# Patient Record
Sex: Female | Born: 1980 | Race: White | Hispanic: No | Marital: Married | Smoking: Never smoker
Health system: Southern US, Community
[De-identification: ages and names within clinical notes are randomized; demographics above are authoritative.]

## PROBLEM LIST (undated history)

## (undated) ENCOUNTER — Inpatient Hospital Stay (HOSPITAL_COMMUNITY): Payer: Self-pay

## (undated) DIAGNOSIS — G44009 Cluster headache syndrome, unspecified, not intractable: Secondary | ICD-10-CM

## (undated) DIAGNOSIS — S83519A Sprain of anterior cruciate ligament of unspecified knee, initial encounter: Secondary | ICD-10-CM

## (undated) HISTORY — PX: NASAL SEPTUM SURGERY: SHX37

## (undated) HISTORY — PX: ANTERIOR CRUCIATE LIGAMENT REPAIR: SHX115

## (undated) HISTORY — PX: HERNIA REPAIR: SHX51

---

## 2008-05-13 ENCOUNTER — Emergency Department: Payer: Self-pay | Admitting: Emergency Medicine

## 2009-12-18 ENCOUNTER — Encounter: Admission: RE | Admit: 2009-12-18 | Discharge: 2009-12-18 | Payer: Self-pay | Admitting: Sports Medicine

## 2011-05-31 ENCOUNTER — Inpatient Hospital Stay (HOSPITAL_COMMUNITY)
Admission: AD | Admit: 2011-05-31 | Discharge: 2011-06-01 | Disposition: A | Discharge status: Home / Self Care | Payer: Self-pay | Source: Ambulatory Visit | Attending: Obstetrics & Gynecology | Admitting: Obstetrics & Gynecology

## 2011-05-31 ENCOUNTER — Encounter (HOSPITAL_COMMUNITY): Payer: Self-pay | Admitting: *Deleted

## 2011-05-31 ENCOUNTER — Inpatient Hospital Stay (HOSPITAL_COMMUNITY): Payer: Self-pay

## 2011-05-31 DIAGNOSIS — N949 Unspecified condition associated with female genital organs and menstrual cycle: Secondary | ICD-10-CM | POA: Insufficient documentation

## 2011-05-31 DIAGNOSIS — N938 Other specified abnormal uterine and vaginal bleeding: Secondary | ICD-10-CM | POA: Insufficient documentation

## 2011-05-31 HISTORY — DX: Cluster headache syndrome, unspecified, not intractable: G44.009

## 2011-05-31 HISTORY — DX: Sprain of anterior cruciate ligament of unspecified knee, initial encounter: S83.519A

## 2011-05-31 LAB — BASIC METABOLIC PANEL
CO2: 25 mEq/L (ref 19–32)
Calcium: 8.5 mg/dL (ref 8.4–10.5)
Creatinine, Ser: 0.65 mg/dL (ref 0.50–1.10)
GFR calc Af Amer: >90 mL/min (ref >90)
GFR calc non Af Amer: >90 mL/min (ref >90)
Potassium: 3.2 mEq/L — ABNORMAL LOW (ref 3.5–5.1)

## 2011-05-31 LAB — CBC
Hemoglobin: 10.7 g/dL — ABNORMAL LOW (ref 12.0–15.0)
Platelets: 215 10*3/uL (ref 150–400)
RBC: 3.41 MIL/uL — ABNORMAL LOW (ref 3.87–5.11)

## 2011-05-31 LAB — DIFFERENTIAL
Basophils Relative: 0 % (ref 0–1)
Eosinophils Absolute: 0.1 10*3/uL (ref 0.0–0.7)
Eosinophils Relative: 1 % (ref 0–5)
Lymphocytes Relative: 19 % (ref 12–46)
Lymphs Abs: 2.2 10*3/uL (ref 0.7–4.0)
Monocytes Relative: 7 % (ref 3–12)

## 2011-05-31 LAB — HCG, QUANTITATIVE, PREGNANCY: hCG, Beta Chain, Quant, S: 52 m[IU]/mL — ABNORMAL HIGH (ref <5)

## 2011-05-31 MED ORDER — METHYLERGONOVINE MALEATE 0.2 MG PO TABS
0.2000 mg | ORAL_TABLET | Freq: Four times a day (QID) | ORAL | Status: DC
  Administered 2011-05-31: 0.2 mg via ORAL
  Filled 2011-05-31: qty 1

## 2011-05-31 NOTE — ED Provider Notes (Signed)
History     Chief Complaint  Patient presents with  . Vaginal Bleeding   HPI 30 yr old G1 P0010 presents via EMS with report of heavy vaginal bleeding.  Had EAB on 05/13/11 and had "normal" bleeding afterward.  Flew home today and had intercourse. About 3 hrs later had heavy vaginal bleeding. States it covered her legs and feet and covered the Bathroom floor. Called EMS who stated she soaked 3 pads on the way in. Reports some cramping but no significant pain. No fever or other symptoms.    Past Medical History  Diagnosis Date  . Cluster headache   . Torn ACL     x3    Past Surgical History  Procedure Date  . Anterior cruciate ligament repair     No family history on file.  History  Substance Use Topics  . Smoking status: Never Smoker   . Smokeless tobacco: Not on file  . Alcohol Use: 0.6 oz/week    1 Cans of beer per week    Allergies: No Known Allergies  Prescriptions prior to admission  Medication Sig Dispense Refill  . acetaminophen (TYLENOL) 500 MG tablet Take 500 mg by mouth every 6 (six) hours as needed. For pain       . norethindrone-ethinyl estradiol (OVCON-50) 1-50 MG-MCG tablet Take 1 tablet by mouth daily.          Review of Systems  Constitutional: Negative for fever.  Gastrointestinal: Negative for nausea and abdominal pain.  Genitourinary:       Heavy vaginal bleeding   Physical Exam   Blood pressure 130/75, pulse 97, temperature 98.1 F (36.7 C), temperature source Oral, resp. rate 20, height 5\' 5"  (1.651 m), weight 143 lb (64.864 kg), last menstrual period 05/14/2011.  Physical Exam  Constitutional: She is oriented to person, place, and time. She appears well-developed and well-nourished.  HENT:  Head: Normocephalic.  Cardiovascular: Normal rate.   Respiratory: Effort normal.  GI: Soft. She exhibits no distension and no mass. There is no tenderness. There is no rebound and no guarding.  Genitourinary: Uterus normal. Vaginal discharge found.       Dried blood on legs but no frank bleeding. Ext Gen. Wnl. 2 small clots in vagina. No active bleeding from cervix which is closed. Uterus slightly posterior, nontender. Adnexae nontender.  Neurological: She is alert and oriented to person, place, and time.  Skin: Skin is warm and dry.  Psychiatric: She has a normal mood and affect.   Results for orders placed during the hospital encounter of 05/31/11 (from the past 24 hour(s))  CBC     Status: Abnormal   Collection Time   05/31/11  9:23 PM      Component Value Range   WBC 11.7 (*) 4.0 - 10.5 (K/uL)   RBC 3.41 (*) 3.87 - 5.11 (MIL/uL)   Hemoglobin 10.7 (*) 12.0 - 15.0 (g/dL)   HCT 45.4 (*) 09.8 - 46.0 (%)   MCV 93.8  78.0 - 100.0 (fL)   MCH 31.4  26.0 - 34.0 (pg)   MCHC 33.4  30.0 - 36.0 (g/dL)   RDW 11.9  14.7 - 82.9 (%)   Platelets 215  150 - 400 (K/uL)  DIFFERENTIAL     Status: Abnormal   Collection Time   05/31/11  9:23 PM      Component Value Range   Neutrophils Relative 74  43 - 77 (%)   Neutro Abs 8.6 (*) 1.7 - 7.7 (K/uL)   Lymphocytes  Relative 19  12 - 46 (%)   Lymphs Abs 2.2  0.7 - 4.0 (K/uL)   Monocytes Relative 7  3 - 12 (%)   Monocytes Absolute 0.8  0.1 - 1.0 (K/uL)   Eosinophils Relative 1  0 - 5 (%)   Eosinophils Absolute 0.1  0.0 - 0.7 (K/uL)   Basophils Relative 0  0 - 1 (%)   Basophils Absolute 0.0  0.0 - 0.1 (K/uL)  HCG, QUANTITATIVE, PREGNANCY     Status: Abnormal   Collection Time   05/31/11  9:23 PM      Component Value Range   hCG, Beta Chain, Quant, S 52 (*) <5 (mIU/mL)  BASIC METABOLIC PANEL     Status: Abnormal   Collection Time   05/31/11  9:23 PM      Component Value Range   Sodium 134 (*) 135 - 145 (mEq/L)   Potassium 3.2 (*) 3.5 - 5.1 (mEq/L)   Chloride 103  96 - 112 (mEq/L)   CO2 25  19 - 32 (mEq/L)   Glucose, Bld 112 (*) 70 - 99 (mg/dL)   BUN 11  6 - 23 (mg/dL)   Creatinine, Ser 1.61  0.50 - 1.10 (mg/dL)   Calcium 8.5  8.4 - 09.6 (mg/dL)   GFR calc non Af Amer >90  >90 (mL/min)   GFR  calc Af Amer >90  >90 (mL/min)    MAU Course  Procedures   Assessment and Plan  Discussed with Dr Arlyce Dice when I found out she went to his office rather than [place].  He will come and see her tonight.   [facility] 05/31/2011, 10:27 PM

## 2011-05-31 NOTE — Progress Notes (Signed)
Had TAB on 9/26, states had normal bleeding afterward.  Flew home from [place] today, had intercourse today, began bleeding heavily with dizziness approx. 3 hrs later.

## 2011-06-01 ENCOUNTER — Encounter (HOSPITAL_COMMUNITY)
Admission: AD | Disposition: A | Discharge status: Home / Self Care | Payer: Self-pay | Source: Ambulatory Visit | Attending: Obstetrics & Gynecology

## 2011-06-01 LAB — GC/CHLAMYDIA PROBE AMP, GENITAL: GC Probe Amp, Genital: NEGATIVE

## 2011-06-01 MED ORDER — METHYLERGONOVINE MALEATE 0.2 MG PO TABS: 0.2000 mg | ORAL_TABLET | Freq: Four times a day (QID) | ORAL | Status: AC

## 2011-06-01 MED ORDER — MISOPROSTOL 200 MCG PO TABS: 200.0000 ug | ORAL_TABLET | ORAL | Status: DC

## 2011-06-01 NOTE — Progress Notes (Signed)
Dr Arlyce Dice in discussing plan of care with the pt

## 2014-06-18 ENCOUNTER — Encounter (HOSPITAL_COMMUNITY): Payer: Self-pay | Admitting: *Deleted

## 2016-04-21 LAB — OB RESULTS CONSOLE RUBELLA ANTIBODY, IGM: RUBELLA: NON-IMMUNE/NOT IMMUNE

## 2016-04-21 LAB — OB RESULTS CONSOLE GC/CHLAMYDIA
CHLAMYDIA, DNA PROBE: NEGATIVE
GC PROBE AMP, GENITAL: NEGATIVE

## 2016-04-21 LAB — OB RESULTS CONSOLE ANTIBODY SCREEN: ANTIBODY SCREEN: NEGATIVE

## 2016-04-21 LAB — OB RESULTS CONSOLE ABO/RH: RH Type: POSITIVE

## 2016-04-21 LAB — OB RESULTS CONSOLE HIV ANTIBODY (ROUTINE TESTING): HIV: NONREACTIVE

## 2016-04-21 LAB — OB RESULTS CONSOLE RPR: RPR: NONREACTIVE

## 2016-04-21 LAB — OB RESULTS CONSOLE HEPATITIS B SURFACE ANTIGEN
HEP B S AG: POSITIVE
Hepatitis B Surface Ag: NEGATIVE

## 2016-05-21 ENCOUNTER — Emergency Department (HOSPITAL_COMMUNITY): Payer: BC Managed Care – PPO

## 2016-05-21 ENCOUNTER — Encounter (HOSPITAL_COMMUNITY): Payer: Self-pay

## 2016-05-21 ENCOUNTER — Emergency Department (HOSPITAL_COMMUNITY)
Admission: EM | Admit: 2016-05-21 | Discharge: 2016-05-21 | Disposition: A | Discharge status: Home / Self Care | Payer: BC Managed Care – PPO | Attending: Emergency Medicine | Admitting: Emergency Medicine

## 2016-05-21 DIAGNOSIS — S161XXA Strain of muscle, fascia and tendon at neck level, initial encounter: Secondary | ICD-10-CM | POA: Diagnosis not present

## 2016-05-21 DIAGNOSIS — Y92410 Unspecified street and highway as the place of occurrence of the external cause: Secondary | ICD-10-CM | POA: Insufficient documentation

## 2016-05-21 DIAGNOSIS — O9A212 Injury, poisoning and certain other consequences of external causes complicating pregnancy, second trimester: Secondary | ICD-10-CM | POA: Diagnosis not present

## 2016-05-21 DIAGNOSIS — Y999 Unspecified external cause status: Secondary | ICD-10-CM | POA: Diagnosis not present

## 2016-05-21 DIAGNOSIS — Z3A17 17 weeks gestation of pregnancy: Secondary | ICD-10-CM | POA: Insufficient documentation

## 2016-05-21 DIAGNOSIS — Y939 Activity, unspecified: Secondary | ICD-10-CM | POA: Insufficient documentation
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
## 2016-09-12 ENCOUNTER — Inpatient Hospital Stay (HOSPITAL_COMMUNITY)
Admission: AD | Admit: 2016-09-12 | Discharge: 2016-09-12 | Disposition: A | Discharge status: Home / Self Care | Payer: BC Managed Care – PPO | Source: Ambulatory Visit | Attending: Obstetrics and Gynecology | Admitting: Obstetrics and Gynecology

## 2016-09-12 ENCOUNTER — Encounter (HOSPITAL_COMMUNITY): Payer: Self-pay | Admitting: *Deleted

## 2016-09-12 DIAGNOSIS — Z3A33 33 weeks gestation of pregnancy: Secondary | ICD-10-CM | POA: Diagnosis not present

## 2016-09-12 DIAGNOSIS — Z79899 Other long term (current) drug therapy: Secondary | ICD-10-CM | POA: Diagnosis not present

## 2016-09-12 DIAGNOSIS — Z9889 Other specified postprocedural states: Secondary | ICD-10-CM | POA: Diagnosis not present

## 2016-09-12 DIAGNOSIS — O368130 Decreased fetal movements, third trimester, not applicable or unspecified: Secondary | ICD-10-CM

## 2016-09-12 DIAGNOSIS — Z3689 Encounter for other specified antenatal screening: Secondary | ICD-10-CM

## 2016-09-12 NOTE — MAU Note (Signed)
Pt states she started to feel the baby move less last night but she wasn't really counting.  So this morning she paid attention and tried to count the movements after eating breakfast and then drinking water.  Pt states for the first two hours she got 12 movements in 2 hours.  Pt states she then called her doctor and he told her to count them again after eating some more and that time she got 4 in one hour.  Pt states she was seen at the office earlier this week and was told her fundal height is not changing so she has a growth ultrasound scheduled for Wednesday 09/16/16.

## 2016-09-12 NOTE — MAU Provider Note (Signed)
  History     CSN: [id number]  Arrival date and time: 09/12/16 0808   First Provider Initiated Contact with Patient 09/12/16 [phone]      Chief Complaint  Patient presents with  . Decreased Fetal Movement   HPI   Ms.Elizabeth Caldwell is a 36 y.o. female G2P0010 @ 5848w2d here in MAU with decreased fetal movement. She is feeling movement, however the movement is very light and subtle "no strong movements". At 0530 this morning she did kick counts and she felt 12 subtle movements in 2 hours.   OB History    Gravida Para Term Preterm AB Living   2       1     SAB TAB Ectopic Multiple Live Births     1            Past Medical History:  Diagnosis Date  . Cluster headache   . Torn ACL    x3    Past Surgical History:  Procedure Laterality Date  . ANTERIOR CRUCIATE LIGAMENT REPAIR      History reviewed. No pertinent family history.  Social History  Substance Use Topics  . Smoking status: Never Smoker  . Smokeless tobacco: Never Used  . Alcohol use 0.6 oz/week    1 Cans of beer per week    Allergies: No Known Allergies  Prescriptions Prior to Admission  Medication Sig Dispense Refill Last Dose  . acetaminophen (TYLENOL) 500 MG tablet Take 500 mg by mouth every 6 (six) hours as needed. For pain    Past Week at Unknown time  . misoprostol (CYTOTEC) 200 MCG tablet Take 1 tablet (200 mcg total) by mouth every 4 (four) hours. 4 tablet 1   . Prenatal Vit-Fe Fumarate-FA (PRENATAL MULTIVITAMIN) TABS tablet Take 1 tablet by mouth daily at 12 noon.   05/21/2016 at Unknown time   No results found for this or any previous visit (from the past 48 hour(s)).  Review of Systems  Gastrointestinal: Negative for abdominal pain.  Genitourinary: Negative for dysuria and vaginal bleeding.   Physical Exam   Blood pressure 123/73, pulse 101, temperature 98.1 F (36.7 C), temperature source Oral, resp. rate 17, last menstrual period 01/23/2016, SpO2 99 %.  Physical Exam  Constitutional: She  is oriented to person, place, and time. She appears well-developed and well-nourished. No distress.  HENT:  Head: Normocephalic.  Eyes: Pupils are equal, round, and reactive to light.  Musculoskeletal: Normal range of motion.  Neurological: She is alert and oriented to person, place, and time.  Skin: Skin is warm. She is not diaphoretic.  Psychiatric: Her behavior is normal.   Fetal Tracing: Baseline: 125 bpm  Variability: Moderate  Accelerations: 15x15 Decelerations: none Toco: 1 contraction   MAU Course  Procedures  None  MDM  Discussed patient with Dr. Dareen PianoAnderson, discussed fetal tracing. Ok to DC home.   Assessment and Plan   A:  1. Decreased fetal movements in third trimester, single or unspecified fetus   2. NST (non-stress test) reactive     P:  Discharge home in stable condition Return to MAU if symptoms worsen Discussed normalcy of fetal kick counts Anterior placenta per prenatal record. Discussed with patient in detail.   Duane LopeJennifer I Rasch, NP 09/12/2016 4:36 PM

## 2016-09-12 NOTE — Discharge Instructions (Signed)
Nonstress Test The nonstress test is a procedure that monitors the fetus's heartbeat. The test will monitor the heartbeat when the fetus is at rest and while the fetus is moving. In a healthy fetus, there will be an increase in fetal heart rate when the fetus moves or kicks. The heart rate will decrease at rest. This test helps determine if the fetus is healthy. Your health care provider will look at a number of patterns in the heart rate tracing to make sure your baby is thriving. If there is concern, your health care provider may order additional tests or may suggest another course of action. This test is often done in the third trimester and can help determine if an early delivery is needed and safe. Common reasons to have this test are:  You are past your due date.  You have a high-risk pregnancy.  You are feeling less movement than normal.  You have lost a pregnancy in the past.  Your health care provider suspects fetal growth problems.  You have too much or too little amniotic fluid. BEFORE THE PROCEDURE  Eat a meal right before the test or as directed by your health care provider. Food may help stimulate fetal movements.  Use the restroom right before the test. PROCEDURE  Two belts will be placed around your abdomen. These belts have monitors attached to them. One records the fetal heart rate and the other records uterine contractions.  You may be asked to lie down on your side or to stay sitting upright.  You may be given a button to press when you feel movement.  The fetal heartbeat is listened to and watched on a screen. The heartbeat is recorded on a sheet of paper.  If the fetus seems to be sleeping, you may be asked to drink some juice or soda, gently press your abdomen, or make some noise to wake the fetus. AFTER THE PROCEDURE  Your health care provider will discuss the test results with you and make recommendations for the near future. This information is not intended  to replace advice given to you by your health care provider. Make sure you discuss any questions you have with your health care provider. Document Released: 07/24/2002 Document Revised: 08/24/2014 Document Reviewed: 09/06/2012 Elsevier Interactive Patient Education  2017 [employer]  

## 2016-10-02 ENCOUNTER — Encounter (HOSPITAL_COMMUNITY): Payer: Self-pay | Admitting: *Deleted

## 2016-10-02 ENCOUNTER — Telehealth (HOSPITAL_COMMUNITY): Payer: Self-pay | Admitting: *Deleted

## 2016-10-02 NOTE — Telephone Encounter (Signed)
Preadmission screen  

## 2016-10-08 ENCOUNTER — Observation Stay (HOSPITAL_COMMUNITY)
Admission: RE | Admit: 2016-10-08 | Discharge: 2016-10-08 | Disposition: A | Discharge status: Home / Self Care | Payer: BC Managed Care – PPO | Source: Ambulatory Visit | Attending: Obstetrics and Gynecology | Admitting: Obstetrics and Gynecology

## 2016-10-08 ENCOUNTER — Encounter (HOSPITAL_COMMUNITY): Payer: Self-pay

## 2016-10-08 DIAGNOSIS — Z3A37 37 weeks gestation of pregnancy: Secondary | ICD-10-CM | POA: Diagnosis not present

## 2016-10-08 DIAGNOSIS — O321XX0 Maternal care for breech presentation, not applicable or unspecified: Secondary | ICD-10-CM | POA: Diagnosis present

## 2016-10-08 LAB — OB RESULTS CONSOLE GBS: STREP GROUP B AG: NEGATIVE

## 2016-10-08 MED ORDER — LACTATED RINGERS IV SOLN
INTRAVENOUS | Status: DC
  Administered 2016-10-08: 08:00:00 via INTRAVENOUS

## 2016-10-08 MED ORDER — TERBUTALINE SULFATE 1 MG/ML IJ SOLN
INTRAMUSCULAR | Status: AC
  Administered 2016-10-08: 0.25 mg via SUBCUTANEOUS
  Filled 2016-10-08: qty 1

## 2016-10-08 MED ORDER — TERBUTALINE SULFATE 1 MG/ML IJ SOLN
0.2500 mg | Freq: Once | INTRAMUSCULAR | Status: AC
  Administered 2016-10-08: 0.25 mg via SUBCUTANEOUS

## 2016-10-08 NOTE — H&P (Signed)
H&P  Pt is a 36 y/o white female at 37 weeks who presents for an attempt at an external version for breech presentation. PNC is uncomplicated PMHX see [place] PE: ABD- gravid, still breech by u/s FHTs reactive IMP/ IUP at 37 weeks         Breech Plan/ Attempt external version
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
## 2016-10-08 NOTE — Discharge Instructions (Signed)
Braxton Hicks Contractions Contractions of the uterus can occur throughout pregnancy. Contractions are not always a sign that you are in labor.  WHAT ARE BRAXTON HICKS CONTRACTIONS?  Contractions that occur before labor are called Braxton Hicks contractions, or false labor. Toward the end of pregnancy (32-34 weeks), these contractions can develop more often and may become more forceful. This is not true labor because these contractions do not result in opening (dilatation) and thinning of the cervix. They are sometimes difficult to tell apart from true labor because these contractions can be forceful and people have different pain tolerances. You should not feel embarrassed if you go to the hospital with false labor. Sometimes, the only way to tell if you are in true labor is for your health care provider to look for changes in the cervix. If there are no prenatal problems or other health problems associated with the pregnancy, it is completely safe to be sent home with false labor and await the onset of true labor. HOW CAN YOU TELL THE DIFFERENCE BETWEEN TRUE AND FALSE LABOR? False Labor   The contractions of false labor are usually shorter and not as hard as those of true labor.   The contractions are usually irregular.   The contractions are often felt in the front of the lower abdomen and in the groin.   The contractions may go away when you walk around or change positions while lying down.   The contractions get weaker and are shorter lasting as time goes on.   The contractions do not usually become progressively stronger, regular, and closer together as with true labor.  True Labor   Contractions in true labor last 30-70 seconds, become very regular, usually become more intense, and increase in frequency.   The contractions do not go away with walking.   The discomfort is usually felt in the top of the uterus and spreads to the lower abdomen and low back.   True labor can be  determined by your health care provider with an exam. This will show that the cervix is dilating and getting thinner.  WHAT TO REMEMBER  Keep up with your usual exercises and follow other instructions given by your health care provider.   Take medicines as directed by your health care provider.   Keep your regular prenatal appointments.   Eat and drink lightly if you think you are going into labor.   If Braxton Hicks contractions are making you uncomfortable:   Change your position from lying down or resting to walking, or from walking to resting.   Sit and rest in a tub of warm water.   Drink 2-3 glasses of water. Dehydration may cause these contractions.   Do slow and deep breathing several times an hour.  WHEN SHOULD I SEEK IMMEDIATE MEDICAL CARE? Seek immediate medical care if:  Your contractions become stronger, more regular, and closer together.   You have fluid leaking or gushing from your vagina.   You have a fever.   You pass blood-tinged mucus.   You have vaginal bleeding.   You have continuous abdominal pain.   You have low back pain that you never had before.   You feel your baby's head pushing down and causing pelvic pressure.   Your baby is not moving as much as it used to.  This information is not intended to replace advice given to you by your health care provider. Make sure you discuss any questions you have with your health care   provider. Document Released: 08/03/2005 Document Revised: 11/25/2015 Document Reviewed: 05/15/2013 Elsevier Interactive Patient Education  2017 [employer] Introduction Patient Name: ________________________________________________ Patient Due Date: ____________________ What is a fetal movement count? A fetal movement count is the number of times that you feel your baby move during a certain amount of time. This may also be called a fetal kick count. A fetal movement count is recommended for every pregnant  woman. You may be asked to start counting fetal movements as early as week 28 of your pregnancy. Pay attention to when your baby is most active. You may notice your baby's sleep and wake cycles. You may also notice things that make your baby move more. You should do a fetal movement count:  When your baby is normally most active.  At the same time each day. A good time to count movements is while you are resting, after having something to eat and drink. How do I count fetal movements? 1. Find a quiet, comfortable area. Sit, or lie down on your side. 2. Write down the date, the start time and stop time, and the number of movements that you felt between those two times. Take this information with you to your health care visits. 3. For 2 hours, count kicks, flutters, swishes, rolls, and jabs. You should feel at least 10 movements during 2 hours. 4. You may stop counting after you have felt 10 movements. 5. If you do not feel 10 movements in 2 hours, have something to eat and drink. Then, keep resting and counting for 1 hour. If you feel at least 4 movements during that hour, you may stop counting. Contact a health care provider if:  You feel fewer than 4 movements in 2 hours.  Your baby is not moving like he or she usually does. Date: ____________ Start time: ____________ Stop time: ____________ Movements: ____________ Date: ____________ Start time: ____________ Stop time: ____________ Movements: ____________ Date: ____________ Start time: ____________ Stop time: ____________ Movements: ____________ Date: ____________ Start time: ____________ Stop time: ____________ Movements: ____________ Date: ____________ Start time: ____________ Stop time: ____________ Movements: ____________ Date: ____________ Start time: ____________ Stop time: ____________ Movements: ____________ Date: ____________ Start time: ____________ Stop time: ____________ Movements: ____________ Date: ____________ Start time:  ____________ Stop time: ____________ Movements: ____________ Date: ____________ Start time: ____________ Stop time: ____________ Movements: ____________ This information is not intended to replace advice given to you by your health care provider. Make sure you discuss any questions you have with your health care provider. Document Released: 09/02/2006 Document Revised: 04/01/2016 Document Reviewed: 09/12/2015 Elsevier Interactive Patient Education  2017 Elsevier Inc.  

## 2016-10-11 ENCOUNTER — Encounter (HOSPITAL_COMMUNITY): Payer: Self-pay

## 2016-10-11 ENCOUNTER — Inpatient Hospital Stay (HOSPITAL_COMMUNITY): Payer: BC Managed Care – PPO | Admitting: Anesthesiology

## 2016-10-11 ENCOUNTER — Encounter (HOSPITAL_COMMUNITY)
Admission: AD | Disposition: A | Discharge status: Home / Self Care | Payer: Self-pay | Source: Ambulatory Visit | Attending: Obstetrics and Gynecology

## 2016-10-11 ENCOUNTER — Inpatient Hospital Stay (HOSPITAL_COMMUNITY)
Admission: AD | Admit: 2016-10-11 | Discharge: 2016-10-13 | DRG: 766 | Disposition: A | Discharge status: Home / Self Care | Payer: BC Managed Care – PPO | Source: Ambulatory Visit | Attending: Obstetrics and Gynecology | Admitting: Obstetrics and Gynecology

## 2016-10-11 DIAGNOSIS — O321XX0 Maternal care for breech presentation, not applicable or unspecified: Secondary | ICD-10-CM | POA: Diagnosis present

## 2016-10-11 DIAGNOSIS — Z3A37 37 weeks gestation of pregnancy: Secondary | ICD-10-CM

## 2016-10-11 DIAGNOSIS — Z3493 Encounter for supervision of normal pregnancy, unspecified, third trimester: Secondary | ICD-10-CM | POA: Diagnosis present

## 2016-10-11 DIAGNOSIS — Z8249 Family history of ischemic heart disease and other diseases of the circulatory system: Secondary | ICD-10-CM | POA: Diagnosis not present

## 2016-10-11 DIAGNOSIS — Z3A41 41 weeks gestation of pregnancy: Secondary | ICD-10-CM

## 2016-10-11 DIAGNOSIS — Z3490 Encounter for supervision of normal pregnancy, unspecified, unspecified trimester: Secondary | ICD-10-CM

## 2016-10-11 LAB — TYPE AND SCREEN
ABO/RH(D): A POS
Antibody Screen: NEGATIVE

## 2016-10-11 LAB — CBC
HEMATOCRIT: 34.7 % — AB (ref 36.0–46.0)
HEMOGLOBIN: 12 g/dL (ref 12.0–15.0)
MCH: 31.5 pg (ref 26.0–34.0)
MCHC: 34.6 g/dL (ref 30.0–36.0)
MCV: 91.1 fL (ref 78.0–100.0)
Platelets: 157 10*3/uL (ref 150–400)
RBC: 3.81 MIL/uL — ABNORMAL LOW (ref 3.87–5.11)
RDW: 13.5 % (ref 11.5–15.5)
WBC: 19.8 10*3/uL — ABNORMAL HIGH (ref 4.0–10.5)

## 2016-10-11 LAB — ABO/RH: ABO/RH(D): A POS

## 2016-10-11 SURGERY — Surgical Case
Anesthesia: Spinal

## 2016-10-11 MED ORDER — ONDANSETRON HCL 4 MG/2ML IJ SOLN
INTRAMUSCULAR | Status: DC | PRN
  Administered 2016-10-11: 4 mg via INTRAVENOUS

## 2016-10-11 MED ORDER — FENTANYL CITRATE (PF) 100 MCG/2ML IJ SOLN
INTRAMUSCULAR | Status: AC
  Filled 2016-10-11: qty 2

## 2016-10-11 MED ORDER — DEXAMETHASONE SODIUM PHOSPHATE 4 MG/ML IJ SOLN
INTRAMUSCULAR | Status: AC
  Filled 2016-10-11: qty 1

## 2016-10-11 MED ORDER — SCOPOLAMINE 1 MG/3DAYS TD PT72
MEDICATED_PATCH | TRANSDERMAL | Status: DC | PRN
  Administered 2016-10-11: 1 via TRANSDERMAL

## 2016-10-11 MED ORDER — DIBUCAINE 1 % RE OINT: 1.0000 "application " | TOPICAL_OINTMENT | RECTAL | Status: DC | PRN

## 2016-10-11 MED ORDER — PHENYLEPHRINE 8 MG IN D5W 100 ML (0.08MG/ML) PREMIX OPTIME
INJECTION | INTRAVENOUS | Status: DC | PRN
  Administered 2016-10-11: 60 ug/min via INTRAVENOUS

## 2016-10-11 MED ORDER — LACTATED RINGERS IV SOLN
INTRAVENOUS | Status: DC | PRN
  Administered 2016-10-11: 40 [IU] via INTRAVENOUS

## 2016-10-11 MED ORDER — COCONUT OIL OIL
1.0000 "application " | TOPICAL_OIL | Status: DC | PRN
  Administered 2016-10-12: 1 via TOPICAL
  Filled 2016-10-11: qty 120

## 2016-10-11 MED ORDER — MORPHINE SULFATE (PF) 0.5 MG/ML IJ SOLN
INTRAMUSCULAR | Status: AC
  Filled 2016-10-11: qty 10

## 2016-10-11 MED ORDER — TETANUS-DIPHTH-ACELL PERTUSSIS 5-2.5-18.5 LF-MCG/0.5 IM SUSP: 0.5000 mL | Freq: Once | INTRAMUSCULAR | Status: DC

## 2016-10-11 MED ORDER — FENTANYL CITRATE (PF) 100 MCG/2ML IJ SOLN
INTRAMUSCULAR | Status: DC | PRN
  Administered 2016-10-11: 20 ug via INTRATHECAL

## 2016-10-11 MED ORDER — FAMOTIDINE IN NACL 20-0.9 MG/50ML-% IV SOLN
20.0000 mg | Freq: Once | INTRAVENOUS | Status: AC
  Administered 2016-10-11: 20 mg via INTRAVENOUS
  Filled 2016-10-11: qty 50

## 2016-10-11 MED ORDER — IBUPROFEN 600 MG PO TABS
600.0000 mg | ORAL_TABLET | Freq: Four times a day (QID) | ORAL | Status: DC
  Administered 2016-10-11 – 2016-10-13 (×7): 600 mg via ORAL
  Filled 2016-10-11 (×7): qty 1

## 2016-10-11 MED ORDER — SODIUM CHLORIDE 0.9 % IR SOLN
Status: DC | PRN
  Administered 2016-10-11: 1000 mL

## 2016-10-11 MED ORDER — DEXAMETHASONE SODIUM PHOSPHATE 4 MG/ML IJ SOLN
INTRAMUSCULAR | Status: DC | PRN
  Administered 2016-10-11: 4 mg via INTRAVENOUS

## 2016-10-11 MED ORDER — LACTATED RINGERS IV SOLN
INTRAVENOUS | Status: DC
  Administered 2016-10-12: 05:00:00 via INTRAVENOUS

## 2016-10-11 MED ORDER — SENNOSIDES-DOCUSATE SODIUM 8.6-50 MG PO TABS
2.0000 | ORAL_TABLET | ORAL | Status: DC
  Administered 2016-10-11 – 2016-10-12 (×2): 2 via ORAL
  Filled 2016-10-11 (×2): qty 2

## 2016-10-11 MED ORDER — ONDANSETRON HCL 4 MG/2ML IJ SOLN
INTRAMUSCULAR | Status: AC
  Filled 2016-10-11: qty 2

## 2016-10-11 MED ORDER — OXYTOCIN 10 UNIT/ML IJ SOLN
INTRAMUSCULAR | Status: AC
  Filled 2016-10-11: qty 4

## 2016-10-11 MED ORDER — CEFAZOLIN SODIUM-DEXTROSE 2-4 GM/100ML-% IV SOLN
2.0000 g | INTRAVENOUS | Status: AC
  Administered 2016-10-11: 2 g via INTRAVENOUS

## 2016-10-11 MED ORDER — MORPHINE SULFATE (PF) 0.5 MG/ML IJ SOLN
INTRAMUSCULAR | Status: DC | PRN
  Administered 2016-10-11: .2 mg via INTRATHECAL

## 2016-10-11 MED ORDER — BUPIVACAINE IN DEXTROSE 0.75-8.25 % IT SOLN
INTRATHECAL | Status: DC | PRN
  Administered 2016-10-11: 1.4 mL via INTRATHECAL

## 2016-10-11 MED ORDER — WITCH HAZEL-GLYCERIN EX PADS: 1.0000 "application " | MEDICATED_PAD | CUTANEOUS | Status: DC | PRN

## 2016-10-11 MED ORDER — LACTATED RINGERS IV BOLUS (SEPSIS)
1000.0000 mL | Freq: Once | INTRAVENOUS | Status: AC
  Administered 2016-10-11: 1000 mL via INTRAVENOUS

## 2016-10-11 MED ORDER — PRENATAL MULTIVITAMIN CH
1.0000 | ORAL_TABLET | Freq: Every day | ORAL | Status: DC
  Administered 2016-10-12 – 2016-10-13 (×2): 1 via ORAL
  Filled 2016-10-11 (×3): qty 1

## 2016-10-11 MED ORDER — ACETAMINOPHEN 325 MG PO TABS
650.0000 mg | ORAL_TABLET | ORAL | Status: DC | PRN
  Administered 2016-10-11: 650 mg via ORAL
  Filled 2016-10-11: qty 2

## 2016-10-11 MED ORDER — SOD CITRATE-CITRIC ACID 500-334 MG/5ML PO SOLN
30.0000 mL | Freq: Once | ORAL | Status: AC
  Administered 2016-10-11: 30 mL via ORAL
  Filled 2016-10-11: qty 15

## 2016-10-11 MED ORDER — ZOLPIDEM TARTRATE 5 MG PO TABS: 5.0000 mg | ORAL_TABLET | Freq: Every evening | ORAL | Status: DC | PRN

## 2016-10-11 MED ORDER — MEASLES, MUMPS & RUBELLA VAC ~~LOC~~ INJ
0.5000 mL | INJECTION | Freq: Once | SUBCUTANEOUS | Status: DC
  Filled 2016-10-11: qty 0.5

## 2016-10-11 MED ORDER — OXYTOCIN 40 UNITS IN LACTATED RINGERS INFUSION - SIMPLE MED: 2.5000 [IU]/h | INTRAVENOUS | Status: AC

## 2016-10-11 MED ORDER — SIMETHICONE 80 MG PO CHEW
80.0000 mg | CHEWABLE_TABLET | ORAL | Status: DC
  Administered 2016-10-11 – 2016-10-12 (×2): 80 mg via ORAL
  Filled 2016-10-11 (×2): qty 1

## 2016-10-11 MED ORDER — LACTATED RINGERS IV SOLN
INTRAVENOUS | Status: DC | PRN
  Administered 2016-10-11 (×3): via INTRAVENOUS

## 2016-10-11 SURGICAL SUPPLY — 29 items
BENZOIN TINCTURE PRP APPL 2/3 (GAUZE/BANDAGES/DRESSINGS) ×3 IMPLANT
CHLORAPREP W/TINT 26ML (MISCELLANEOUS) ×3 IMPLANT
CLAMP CORD UMBIL (MISCELLANEOUS) IMPLANT
CLOSURE STERI STRIP 1/2 X4 (GAUZE/BANDAGES/DRESSINGS) ×3 IMPLANT
CLOTH BEACON ORANGE TIMEOUT ST (SAFETY) ×3 IMPLANT
CONTAINER PREFILL 10% NBF 15ML (MISCELLANEOUS) IMPLANT
DRSG OPSITE POSTOP 4X10 (GAUZE/BANDAGES/DRESSINGS) ×3 IMPLANT
ELECT REM PT RETURN 9FT ADLT (ELECTROSURGICAL) ×3
ELECTRODE REM PT RTRN 9FT ADLT (ELECTROSURGICAL) ×1 IMPLANT
EXTRACTOR VACUUM M CUP 4 TUBE (SUCTIONS) IMPLANT
EXTRACTOR VACUUM M CUP 4' TUBE (SUCTIONS)
GLOVE BIOGEL PI IND STRL 7.0 (GLOVE) ×1 IMPLANT
GLOVE BIOGEL PI INDICATOR 7.0 (GLOVE) ×2
GLOVE ECLIPSE 7.0 STRL STRAW (GLOVE) ×6 IMPLANT
GOWN STRL REUS W/TWL LRG LVL3 (GOWN DISPOSABLE) ×6 IMPLANT
KIT ABG SYR 3ML LUER SLIP (SYRINGE) IMPLANT
NEEDLE HYPO 25X5/8 SAFETYGLIDE (NEEDLE) IMPLANT
NS IRRIG 1000ML POUR BTL (IV SOLUTION) ×3 IMPLANT
PACK C SECTION WH (CUSTOM PROCEDURE TRAY) ×3 IMPLANT
PAD OB MATERNITY 4.3X12.25 (PERSONAL CARE ITEMS) ×3 IMPLANT
SUT MNCRL 0 VIOLET CTX 36 (SUTURE) ×4 IMPLANT
SUT MON AB 2-0 CT1 27 (SUTURE) ×6 IMPLANT
SUT MONOCRYL 0 CTX 36 (SUTURE) ×8
SUT PLAIN 0 NONE (SUTURE) IMPLANT
SUT PLAIN 2 0 (SUTURE) ×2
SUT PLAIN ABS 2-0 CT1 27XMFL (SUTURE) ×1 IMPLANT
SUT VIC AB 4-0 KS 27 (SUTURE) ×3 IMPLANT
TOWEL OR 17X24 6PK STRL BLUE (TOWEL DISPOSABLE) ×3 IMPLANT
TRAY FOLEY CATH SILVER 14FR (SET/KITS/TRAYS/PACK) IMPLANT

## 2016-10-11 NOTE — Consult Note (Signed)
Neonatology Note:   Attendance at C-section:    I was asked by Dr. Dareen PianoAnderson to attend this primary C/S at 37 3/7 weeks due to onset of labor and breech presentation. The mother is a G2P0A1 A pos, Rubella NI,  GBS negative with an uncomplicated pregnancy. ROM at delivery, fluid clear. Infant delivered frank breech and was vigorous with good spontaneous cry and tone. Needed only minimal bulb suctioning. Ap 9/9. PE remarkable for swollen right buttock (presenting part), testes undescended (but can palpate at least left testis in canal), left transverse palmar crease, and marked breech head. Lungs clear to ausc in DR. To CN to care of Pediatrician.   Elizabeth Souhristie C. Ekaterina Denise, MD

## 2016-10-11 NOTE — Anesthesia Postprocedure Evaluation (Signed)
Anesthesia Post Note  Patient: Elizabeth HaskellSamantha Caldwell  Procedure(s) Performed: Procedure(s) (LRB): CESAREAN SECTION (N/A)  Patient location during evaluation: PACU Anesthesia Type: Spinal Level of consciousness: awake Pain management: pain level controlled Vital Signs Assessment: post-procedure vital signs reviewed and stable Cardiovascular status: stable Postop Assessment: no headache, no backache, spinal receding, patient able to bend at knees and no signs of nausea or vomiting Anesthetic complications: no        Last Vitals:  Vitals:   10/11/16 2015 10/11/16 2016  BP: 110/71   Pulse: 66 71  Resp: 20 19  Temp:      Last Pain: There were no vitals filed for this visit. Pain Goal:                 Gorden Stthomas JR,JOHN Johnathon Mittal

## 2016-10-11 NOTE — Anesthesia Procedure Notes (Signed)
Spinal  Patient location during procedure: OR Start time: 10/11/2016 6:26 PM End time: 10/11/2016 6:29 PM Staffing Anesthesiologist: Leilani AbleHATCHETT, Skyley Grandmaison Performed: anesthesiologist  Preanesthetic Checklist Completed: patient identified, surgical consent, pre-op evaluation, timeout performed, IV checked, risks and benefits discussed and monitors and equipment checked Spinal Block Patient position: sitting Prep: site prepped and draped and DuraPrep Patient monitoring: heart rate, cardiac monitor, continuous pulse ox and blood pressure Approach: midline Location: L3-4 Injection technique: single-shot Needle Needle type: Pencan  Needle gauge: 24 G Needle length: 9 cm Needle insertion depth: 6 cm Assessment Sensory level: T4

## 2016-10-11 NOTE — H&P (Signed)
Elizabeth HaskellSamantha Caldwell is an 36 y.o. G2P0010 788w3d white female.  Who presents to the ER c/o contractions. She was 5-6 cm on admission.She is a known breech presentation. She had an attempt at an external version last week. Her preg has been uncomplicated. Chief Complaint: HPI:  Past Medical History:  Diagnosis Date  . Cluster headache   . Torn ACL    x3    Past Surgical History:  Procedure Laterality Date  . ANTERIOR CRUCIATE LIGAMENT REPAIR    . HERNIA REPAIR    . NASAL SEPTUM SURGERY      Family History  Problem Relation Age of Onset  . Hypertension Mother   . Hypertension Father    Social History:  reports that she has never smoked. She has never used smokeless tobacco. She reports that she drinks about 0.6 oz of alcohol per week . She reports that she does not use drugs.  Allergies: No Known Allergies  Medications Prior to Admission  Medication Sig Dispense Refill  . acetaminophen (TYLENOL) 500 MG tablet Take 500 mg by mouth every 6 (six) hours as needed. For pain     . Prenatal Vit-Fe Fumarate-FA (PRENATAL MULTIVITAMIN) TABS tablet Take 1 tablet by mouth daily at 12 noon.         Blood pressure 146/87, pulse 98, last menstrual period 01/23/2016. Lungs: clear to auscultation bilaterally Heart: regular rate and rhythm, S1, S2 normal, no murmur, click, rub or gallop Abdomen: soft, non-tender; bowel sounds normal; no masses,  no organomegaly and gravid with palp contractions. Breech confirmed with u/s   Lab Results  Component Value Date   WBC 19.8 (H) 10/11/2016   HGB 12.0 10/11/2016   HCT 34.7 (L) 10/11/2016   MCV 91.1 10/11/2016   PLT 157 10/11/2016   No results found for: PREGTESTUR, PREGSERUM, HCG, HCGQUANT     There are no active problems to display for this patient.  IUP in labor Breech presentation Plan/Will proceed to the OR for C/S  ANDERSON,MARK E 10/11/2016, 6:16 PM

## 2016-10-11 NOTE — Progress Notes (Signed)
Dr Dareen PianoAnderson notified of pt's VE 5.5 presentation frank breech, states to have pt scanned for presentation and to call him back

## 2016-10-11 NOTE — MAU Note (Addendum)
Ctx started at noon today. Pt called Dr Dareen PianoAnderson and was advised to drink lots of water. Having lots of bloody show. More consistent contractions the last hour or two.

## 2016-10-11 NOTE — Transfer of Care (Signed)
Immediate Anesthesia Transfer of Care Note  Patient: Rudean HaskellSamantha Ermis  Procedure(s) Performed: Procedure(s): CESAREAN SECTION (N/A)  Patient Location: PACU  Anesthesia Type:Spinal  Level of Consciousness: awake, alert  and oriented  Airway & Oxygen Therapy: Patient Spontanous Breathing  Post-op Assessment: Report given to RN and Post -op Vital signs reviewed and stable  Post vital signs: Reviewed and stable  Last Vitals:  Vitals:   10/11/16 1748 10/11/16 1750  BP: 152/95 146/87  Pulse: 96 98    Last Pain: There were no vitals filed for this visit.       Complications: No apparent anesthesia complications

## 2016-10-11 NOTE — Anesthesia Preprocedure Evaluation (Signed)
Anesthesia Evaluation  Patient identified by MRN, date of birth, ID band Patient awake    Reviewed: Allergy & Precautions, H&P , NPO status , Patient's Chart, lab work & pertinent test results  Airway Mallampati: I  TM Distance: >3 FB Neck ROM: full    Dental no notable dental hx.    Pulmonary neg pulmonary ROS,    Pulmonary exam normal       Cardiovascular negative cardio ROS Normal cardiovascular exam    Neuro/Psych negative psych ROS   GI/Hepatic negative GI ROS, Neg liver ROS,   Endo/Other  negative endocrine ROS  Renal/GU negative Renal ROS     Musculoskeletal   Abdominal Normal abdominal exam  (+)   Peds  Hematology negative hematology ROS (+)   Anesthesia Other Findings   Reproductive/Obstetrics (+) Pregnancy                             Anesthesia Physical Anesthesia Plan  ASA: II  Anesthesia Plan: Spinal   Post-op Pain Management:    Induction:   Airway Management Planned:   Additional Equipment:   Intra-op Plan:   Post-operative Plan:   Informed Consent: I have reviewed the patients History and Physical, chart, labs and discussed the procedure including the risks, benefits and alternatives for the proposed anesthesia with the patient or authorized representative who has indicated his/her understanding and acceptance.     Plan Discussed with: CRNA and Surgeon  Anesthesia Plan Comments:         Anesthesia Quick Evaluation  

## 2016-10-11 NOTE — Op Note (Signed)
NAMQuenten Raven:  Caldwell, Elizabeth           ACCOUNT NO.:  [id number]  MEDICAL RECORD NO.:  [id number]  LOCATION:  [facility]                          FACILITY:  [facility]  PHYSICIAN:  Malva LimesMark Anderson, M.D.    DATE OF BIRTH:  [birth year]  DATE OF PROCEDURE:  10/11/2016 DATE OF DISCHARGE:                              OPERATIVE REPORT   PREOPERATIVE DIAGNOSES: 1. Intrauterine pregnancy at 37 weeks estimated gestational age. 2. Active labor. 3. Breech presentation.  POSTOPERATIVE DIAGNOSES: 1. Intrauterine pregnancy at 37 weeks estimated gestational age. 2. Active labor. 3. Breech presentation.  PROCEDURE:  Primary low-transverse cesarean section.  SURGEON:  Malva LimesMark Anderson, M.D.  ANESTHESIA:  Spinal.  ANTIBIOTICS:  Ancef 2 g.  DRAINS:  Foley bedside drainage.  ESTIMATED BLOOD LOSS:  900 mL.  SPECIMENS:  None.  FINDINGS:  The patient had normal fallopian tubes and ovaries bilaterally.  The uterine cavity appeared to be within normal limits. Placenta was within normal limits.  DESCRIPTION OF PROCEDURE:  The patient was taken to the operating room when she was 7-8 cm.  A spinal anesthetic was placed without difficulty. She was then placed in dorsal supine position with a left lateral tilt. She was prepped and draped in usual fashion for this procedure.  A catheter was placed in her bladder.  Once an adequate level was reached, a Pfannenstiel incision was made 2 cm above the pubic symphysis.  On entering the abdominal cavity, the bladder flap was taken down with sharp dissection.  A low-transverse uterine incision was made in the midline with the Metzenbaum scissors and extended laterally with blunt dissection.  Amniotic fluid was noted be clear.  The infant was delivered in the frank breech presentation.  On delivery of the head, the oropharynx and nostrils were bulb suctioned.  The cord was doubly clamped and cut and the infant handed to the awaiting NICU team.  Cord blood was then obtained.   Placenta was manually removed.  The uterus was exteriorized and examined.  The uterine cavity was wiped with wet lap. The uterine incision was closed in a single layer of 0 Monocryl suture in a running, locking fashion.  There was some bleeding noted at the patient's right angle of the uterus, which was made hemostatic with 2 interrupted figure-of-eights of 0 Monocryl suture.  Once hemostasis was obtained, the bladder flap was closed using 2-0 Monocryl in a running fashion.  The uterus was placed back in the abdominal cavity.  Ovaries and fallopian tubes appeared to be normal.  Hemostasis was good.  The parietal peritoneum and rectus muscles were reapproximated in midline using 2-0 Monocryl in a running fashion.  The fascia was closed using 0 Monocryl suture in a running fashion.  Subcuticular tissue was irrigated and closed with interrupted 2-0 plain gut sutures, 4-0 Vicryl was then used to close the skin in a subcuticular fashion.  Steri-Strips were placed.  The patient was taken to recovery room in stable condition. Instrument and lap counts were correct x3.          ______________________________ Malva LimesMark Anderson, M.D.     MA/MEDQ  D:  10/11/2016  T:  10/11/2016  Job:  [id number]

## 2016-10-12 ENCOUNTER — Encounter (HOSPITAL_COMMUNITY): Payer: Self-pay | Admitting: Obstetrics and Gynecology

## 2016-10-12 LAB — RPR: RPR Ser Ql: NONREACTIVE

## 2016-10-12 LAB — CBC
HEMATOCRIT: 25.7 % — AB (ref 36.0–46.0)
Hemoglobin: 9.2 g/dL — ABNORMAL LOW (ref 12.0–15.0)
MCH: 32.5 pg (ref 26.0–34.0)
MCHC: 35.8 g/dL (ref 30.0–36.0)
MCV: 90.8 fL (ref 78.0–100.0)
Platelets: 124 10*3/uL — ABNORMAL LOW (ref 150–400)
RBC: 2.83 MIL/uL — ABNORMAL LOW (ref 3.87–5.11)
RDW: 13.5 % (ref 11.5–15.5)
WBC: 20.3 10*3/uL — AB (ref 4.0–10.5)

## 2016-10-12 MED ORDER — OXYCODONE-ACETAMINOPHEN 5-325 MG PO TABS
1.0000 | ORAL_TABLET | ORAL | Status: DC | PRN
  Administered 2016-10-13: 1 via ORAL
  Filled 2016-10-12: qty 1

## 2016-10-12 MED ORDER — OXYCODONE-ACETAMINOPHEN 5-325 MG PO TABS: 2.0000 | ORAL_TABLET | ORAL | Status: DC | PRN

## 2016-10-12 NOTE — Lactation Note (Signed)
This note was copied from a baby's chart. Lactation Consultation Note Mom hadn't called out for assistance during the night. Nursing and lab in and out of rm, LC attempted consult. Had encouraged mom to call Lc for next feeding, mom stated she had latched fine and was to tired to listen to any teaching.  Mom currently feeding baby w/cross cradle position STS. Mom has everted nipple to Rt, breast when baby came off. Mom denied painful latches.  Asked mom if has BF in football hold, mom stated yes. Mom had written down I&O, praised for that. encouraged mom to call for assistance or questions with BF. Encouraged mom to call this morning after mom is feeling better and receptive for [facility] and teaching. Mom stated she would. Mom definitely needs assistance w/latching, mom pushes nipple in baby's mouth and lets baby suck nipple in.  LC noted that wasn't receptive to suggestions at this time. Reported to RN. Patient Name: Boy Rudean HaskellSamantha Grill ZHYQM'VToday's Date: 10/12/2016 Reason for consult: Initial assessment   Maternal Data    Feeding Feeding Type: Breast Fed Length of feed: 20 min  LATCH Score/Interventions Latch: Repeated attempts needed to sustain latch, nipple held in mouth throughout feeding, stimulation needed to elicit sucking reflex.  Audible Swallowing: A few with stimulation Intervention(s): Skin to skin  Type of Nipple: Everted at rest and after stimulation  Comfort (Breast/Nipple): Soft / non-tender     Hold (Positioning): No assistance needed to correctly position infant at breast. (not receptive to assistance) Intervention(s): Position options;Skin to skin;Support Pillows  LATCH Score: 8  Lactation Tools Discussed/Used     Consult Status Consult Status: Follow-up Date: 10/12/16 Follow-up type: In-patient    Eliya Geiman, Diamond NickelLAURA G 10/12/2016, 6:03 AM

## 2016-10-12 NOTE — Anesthesia Postprocedure Evaluation (Addendum)
Anesthesia Post Note  Patient: Elizabeth HaskellSamantha Caldwell  Procedure(s) Performed: Procedure(s) (LRB): CESAREAN SECTION (N/A)  Patient location during evaluation: Mother Baby Anesthesia Type: Spinal Level of consciousness: awake Pain management: satisfactory to patient Vital Signs Assessment: post-procedure vital signs reviewed and stable Respiratory status: spontaneous breathing Cardiovascular status: stable Anesthetic complications: no        Last Vitals:  Vitals:   10/12/16 0110 10/12/16 0532  BP: 110/60 (!) 103/58  Pulse: 62 74  Resp: 20 16  Temp: 36.8 C 36.7 C    Last Pain:  Vitals:   10/12/16 0700  TempSrc:   PainSc: 0-No pain   Pain Goal:                 KeyCorpBURGER,LINDA

## 2016-10-12 NOTE — Lactation Note (Signed)
This note was copied from a baby's chart. Lactation Consultation Note  Patient Name: Boy Rudean HaskellSamantha Fleischhacker ZOXWR'UToday's Date: 10/12/2016 Reason for consult: Follow-up assessment Baby at 23 hr of life. Baby has returned from circumcision but it very sleepy. Mom requested help setting up her DEBP. Discussed baby behavior, feeding frequency, baby belly size, voids, wt loss, breast changes, and nipple care. Mom stated she can manually express and has spoon in room. She is aware of lactation services and support group.  Mom will offer the breast on demand 8+/24hr. She will express and spoon feed per volume guidelines as needed.     Maternal Data Has patient been taught Hand Expression?: Yes Does the patient have breastfeeding experience prior to this delivery?: No  Feeding Feeding Type: Breast Fed Length of feed: 0 min  LATCH Score/Interventions Latch: Too sleepy or reluctant, no latch achieved, no sucking elicited.                    Lactation Tools Discussed/Used Pump Review: Setup, frequency, and cleaning;Milk Storage;Other (comment) (pump settings) Initiated by:: ES Date initiated:: 10/12/16   Consult Status Consult Status: Follow-up Date: 10/13/16 Follow-up type: In-patient    Rulon Eisenmengerlizabeth E Angelyse Heslin 10/12/2016, 6:26 PM

## 2016-10-12 NOTE — Progress Notes (Signed)
POD#1 Pt without complaints. Lochia wnl VSSAF CBC- stable IMP/ POD#1 stable Paln/ Routine care

## 2016-10-12 NOTE — Lactation Note (Signed)
This note was copied from a baby's chart. Lactation Consultation Note  Patient Name: Elizabeth Caldwell AOZHY'QToday's Date: 10/12/2016 Reason for consult: Follow-up assessment Baby at 21 hr of life. Mom was in the shower and baby was in the CN. Dad stated baby is bf well but mom does have questions about the DEBP. Instructed him to have Mom call for lactation when she gets out of the shower.   Maternal Data    Feeding Feeding Type: Breast Fed Length of feed: 30 min  LATCH Score/Interventions                      Lactation Tools Discussed/Used     Consult Status Consult Status: Follow-up Date: 10/13/16 Follow-up type: In-patient    Elizabeth Caldwell 10/12/2016, 4:35 PM

## 2016-10-12 NOTE — Lactation Note (Signed)
This note was copied from a baby's chart. Lactation Consultation Note Parents having dinner. Mom stated baby BF approx. 30 min. Ago. LC asked mom to call for next feeding for LC. Mom agreed. Patient Name: Elizabeth Caldwell HaskellSamantha Kading ONGEX'BToday's Date: 10/12/2016     Maternal Data    Feeding Feeding Type: Breast Fed Length of feed: 20 min  LATCH Score/Interventions                      Lactation Tools Discussed/Used     Consult Status      Zaim Nitta G 10/12/2016, 1:48 AM

## 2016-10-13 MED ORDER — OXYCODONE-ACETAMINOPHEN 5-325 MG PO TABS: 2.0000 | ORAL_TABLET | ORAL | 0 refills | Status: DC | PRN

## 2016-10-13 NOTE — Discharge Summary (Signed)
Obstetric Discharge Summary Reason for Admission: cesarean section Prenatal Procedures: ultrasound Intrapartum Procedures: cesarean: low cervical, transverse Postpartum Procedures: Rubella Ig Complications-Operative and Postpartum: none   Discharge Diagnoses: Term Pregnancy-delivered  Discharge Information: Date: 10/13/2016 Activity: pelvic rest Diet: routine Medications: Iron and Percocet Condition: stable Instructions: refer to practice specific booklet Discharge to: home Follow-up Information    Levi AlandANDERSON,MARK E, MD Follow up in 4 week(s).   Specialty:  Obstetrics and Gynecology Contact information: [address] [place] [id number] [phone]           Newborn Data: Live born female  Birth Weight: 6 lb 15.1 oz (3150 g) APGAR: 9, 9  Home with mother.  Jacolby Risby A 10/13/2016, 10:10 AM

## 2016-10-13 NOTE — Lactation Note (Signed)
This note was copied from a baby's chart. Lactation Consultation Note  P1 mom states baby has been sluggish at breast since circ yesterday.  Baby undressed and placed STS with mom.  Baby latched with breast compression to right side easily.  LC reviewed waking techniques with baby.  Baby stayed active at breast with rhythmic jaw movement note and audible swallows.  Mom's breast are tender, and demonstrated hand expression and was advised to pre and post hand express and to leave EBM to nipple area after feed.  Baby has small mouth but when he did after a few attempts open wide; latch was easily obtained and sustained.  Baby fell asleep was burped then placed back to breast on left side, which mom stated she had more trouble with.  Baby latched well and was gulping at breast.  Mom wanted to try her pump from home and she pumped for 15 minutes with her DEBP Medela.  Approximately 20 mls of colostrum was collected.  Mom prefers to give milk back to baby before DC.  LC encouraged mom to call out for RN or LC to help to finger feed infant.  Engorgement care reviewed with mom.  Also,  Reminded mom of BF support groups and OP services and phone number if needed.      Patient Name: Elizabeth Rudean HaskellSamantha Caldwell ZOXWR'UToday's Date: 10/13/2016 Reason for consult: Follow-up assessment   Maternal Data Formula Feeding for Exclusion: No  Feeding Feeding Type: Breast Fed Length of feed: 11 min  LATCH Score/Interventions Latch: Grasps breast easily, tongue down, lips flanged, rhythmical sucking. Intervention(s): Adjust position;Assist with latch;Breast massage;Breast compression  Audible Swallowing: A few with stimulation Intervention(s): Skin to skin;Hand expression  Type of Nipple: Everted at rest and after stimulation  Comfort (Breast/Nipple): Filling, red/small blisters or bruises, mild/mod discomfort (expressed breastmilk to nipples/tender)     Hold (Positioning): Assistance needed to correctly position infant  at breast and maintain latch. Intervention(s): Position options;Support Pillows;Breastfeeding basics reviewed;Skin to skin  LATCH Score: 7  Lactation Tools Discussed/Used Tools: Pump Breast pump type: Double-Electric Breast Pump (personal pump: medela: mom used this morning just to see how it worked)   Consult Status Consult Status: Complete    Maryruth HancockKelly Suzanne [facility] CenterBlack 10/13/2016, 9:52 AM

## 2016-10-13 NOTE — Progress Notes (Addendum)
  Patient is eating, ambulating, voiding.  Pain control is good.  Vitals:   10/12/16 0921 10/12/16 1330 10/12/16 1841 10/13/16 0626  BP: (!) 96/54 118/62 122/61 (!) 104/53  Pulse: 76 74 68 63  Resp: '16 16 18 16  '$ Temp: 98.4 F (36.9 C) 98.4 F (36.9 C) 98.9 F (37.2 C) 98.2 F (36.8 C)  TempSrc: Oral Oral Oral Oral  SpO2: 97%       lungs:   clear to auscultation cor:    RRR Abdomen:  soft, appropriate tenderness, incisions intact and without erythema or exudate ex:    no cords   Lab Results  Component Value Date   WBC 20.3 (H) 10/12/2016   HGB 9.2 (L) 10/12/2016   HCT 25.7 (L) 10/12/2016   MCV 90.8 10/12/2016   PLT 124 (L) 10/12/2016    --/--/A POS, A POS (02/25 1755)/RNI  A/P    Post operative day 2.  Routine post op and postpartum care.  Expect d/c routine.  Percocet for pain control. MMR given.

## 2016-10-13 NOTE — Plan of Care (Signed)
Problem: Education: Goal: Knowledge of condition will improve Discharge education reviewed with mother and father. Incision care, when to call the doctor and newborn care reviewed. Mother and father verbalize understanding of information.

## 2017-01-22 NOTE — Addendum Note (Signed)
Addendum  created 01/22/17 1035 by Arrion Broaddus, MD   Sign clinical note    

## 2017-12-07 ENCOUNTER — Ambulatory Visit: Payer: Self-pay | Admitting: Family Medicine
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
## 2019-01-18 ENCOUNTER — Other Ambulatory Visit: Payer: Self-pay | Admitting: Obstetrics and Gynecology

## 2019-02-15 ENCOUNTER — Inpatient Hospital Stay (HOSPITAL_COMMUNITY): Payer: BC Managed Care – PPO | Admitting: Anesthesiology

## 2019-02-15 ENCOUNTER — Inpatient Hospital Stay (HOSPITAL_COMMUNITY)
Admission: AD | Admit: 2019-02-15 | Discharge: 2019-02-17 | DRG: 788 | Disposition: A | Discharge status: Home / Self Care | Payer: BC Managed Care – PPO | Attending: Obstetrics and Gynecology | Admitting: Obstetrics and Gynecology

## 2019-02-15 ENCOUNTER — Encounter (HOSPITAL_COMMUNITY): Payer: Self-pay | Admitting: *Deleted

## 2019-02-15 ENCOUNTER — Encounter (HOSPITAL_COMMUNITY)
Admission: AD | Disposition: A | Discharge status: Home / Self Care | Payer: Self-pay | Source: Home / Self Care | Attending: Obstetrics and Gynecology

## 2019-02-15 DIAGNOSIS — O9902 Anemia complicating childbirth: Secondary | ICD-10-CM | POA: Diagnosis present

## 2019-02-15 DIAGNOSIS — Z1159 Encounter for screening for other viral diseases: Secondary | ICD-10-CM

## 2019-02-15 DIAGNOSIS — O26893 Other specified pregnancy related conditions, third trimester: Secondary | ICD-10-CM | POA: Diagnosis present

## 2019-02-15 DIAGNOSIS — O34211 Maternal care for low transverse scar from previous cesarean delivery: Principal | ICD-10-CM | POA: Diagnosis present

## 2019-02-15 DIAGNOSIS — Z3A37 37 weeks gestation of pregnancy: Secondary | ICD-10-CM | POA: Diagnosis not present

## 2019-02-15 DIAGNOSIS — Z3490 Encounter for supervision of normal pregnancy, unspecified, unspecified trimester: Secondary | ICD-10-CM

## 2019-02-15 DIAGNOSIS — Z98891 History of uterine scar from previous surgery: Secondary | ICD-10-CM

## 2019-02-15 DIAGNOSIS — D649 Anemia, unspecified: Secondary | ICD-10-CM | POA: Diagnosis present

## 2019-02-15 LAB — RPR: RPR Ser Ql: NONREACTIVE

## 2019-02-15 LAB — SARS CORONAVIRUS 2 BY RT PCR (HOSPITAL ORDER, PERFORMED IN ~~LOC~~ HOSPITAL LAB): SARS Coronavirus 2: NEGATIVE

## 2019-02-15 LAB — TYPE AND SCREEN
ABO/RH(D): A POS
Antibody Screen: NEGATIVE

## 2019-02-15 LAB — CBC
HCT: 35.2 % — ABNORMAL LOW (ref 36.0–46.0)
Hemoglobin: 11.7 g/dL — ABNORMAL LOW (ref 12.0–15.0)
MCH: 31.2 pg (ref 26.0–34.0)
MCHC: 33.2 g/dL (ref 30.0–36.0)
MCV: 93.9 fL (ref 80.0–100.0)
Platelets: 181 10*3/uL (ref 150–400)
RBC: 3.75 MIL/uL — ABNORMAL LOW (ref 3.87–5.11)
RDW: 13.2 % (ref 11.5–15.5)
WBC: 10.1 10*3/uL (ref 4.0–10.5)
nRBC: 0 % (ref 0.0–0.2)

## 2019-02-15 SURGERY — Surgical Case
Anesthesia: Spinal

## 2019-02-15 MED ORDER — BUPIVACAINE IN DEXTROSE 0.75-8.25 % IT SOLN
INTRATHECAL | Status: DC | PRN
  Administered 2019-02-15: 1.7 mL via INTRATHECAL

## 2019-02-15 MED ORDER — DIBUCAINE (PERIANAL) 1 % EX OINT: 1.0000 "application " | TOPICAL_OINTMENT | CUTANEOUS | Status: DC | PRN

## 2019-02-15 MED ORDER — ONDANSETRON HCL 4 MG/2ML IJ SOLN: 4.0000 mg | Freq: Three times a day (TID) | INTRAMUSCULAR | Status: DC | PRN

## 2019-02-15 MED ORDER — SCOPOLAMINE 1 MG/3DAYS TD PT72
1.0000 | MEDICATED_PATCH | Freq: Once | TRANSDERMAL | Status: DC
  Administered 2019-02-15: 1.5 mg via TRANSDERMAL
  Filled 2019-02-15: qty 1

## 2019-02-15 MED ORDER — DIPHENHYDRAMINE HCL 25 MG PO CAPS: 25.0000 mg | ORAL_CAPSULE | ORAL | Status: DC | PRN

## 2019-02-15 MED ORDER — FENTANYL CITRATE (PF) 100 MCG/2ML IJ SOLN
INTRAMUSCULAR | Status: AC
  Filled 2019-02-15: qty 2

## 2019-02-15 MED ORDER — PHENYLEPHRINE 40 MCG/ML (10ML) SYRINGE FOR IV PUSH (FOR BLOOD PRESSURE SUPPORT)
PREFILLED_SYRINGE | INTRAVENOUS | Status: AC
  Filled 2019-02-15: qty 10

## 2019-02-15 MED ORDER — LACTATED RINGERS IV BOLUS: 1000.0000 mL | Freq: Once | INTRAVENOUS | Status: DC

## 2019-02-15 MED ORDER — FENTANYL CITRATE (PF) 100 MCG/2ML IJ SOLN
25.0000 ug | INTRAMUSCULAR | Status: DC | PRN
  Administered 2019-02-15 (×2): 50 ug via INTRAVENOUS

## 2019-02-15 MED ORDER — LACTATED RINGERS IV SOLN: INTRAVENOUS | Status: DC

## 2019-02-15 MED ORDER — KETOROLAC TROMETHAMINE 30 MG/ML IJ SOLN
30.0000 mg | Freq: Four times a day (QID) | INTRAMUSCULAR | Status: AC | PRN
  Administered 2019-02-15 – 2019-02-16 (×2): 30 mg via INTRAVENOUS
  Filled 2019-02-15 (×2): qty 1

## 2019-02-15 MED ORDER — MEASLES, MUMPS & RUBELLA VAC IJ SOLR: 0.5000 mL | Freq: Once | INTRAMUSCULAR | Status: DC

## 2019-02-15 MED ORDER — OXYTOCIN 40 UNITS IN NORMAL SALINE INFUSION - SIMPLE MED
INTRAVENOUS | Status: AC
  Filled 2019-02-15: qty 1000

## 2019-02-15 MED ORDER — WITCH HAZEL-GLYCERIN EX PADS: 1.0000 "application " | MEDICATED_PAD | CUTANEOUS | Status: DC | PRN

## 2019-02-15 MED ORDER — SENNOSIDES-DOCUSATE SODIUM 8.6-50 MG PO TABS
2.0000 | ORAL_TABLET | ORAL | Status: DC
  Administered 2019-02-15 – 2019-02-16 (×2): 2 via ORAL
  Filled 2019-02-15 (×2): qty 2

## 2019-02-15 MED ORDER — FAMOTIDINE IN NACL 20-0.9 MG/50ML-% IV SOLN
20.0000 mg | Freq: Once | INTRAVENOUS | Status: DC
  Filled 2019-02-15: qty 50

## 2019-02-15 MED ORDER — FENTANYL CITRATE (PF) 100 MCG/2ML IJ SOLN
INTRAMUSCULAR | Status: DC | PRN
  Administered 2019-02-15: 15 ug via INTRATHECAL

## 2019-02-15 MED ORDER — LACTATED RINGERS IV SOLN
INTRAVENOUS | Status: DC
  Administered 2019-02-15: 22:00:00 via INTRAVENOUS

## 2019-02-15 MED ORDER — OXYCODONE-ACETAMINOPHEN 5-325 MG PO TABS: 1.0000 | ORAL_TABLET | ORAL | Status: DC | PRN

## 2019-02-15 MED ORDER — NALBUPHINE HCL 10 MG/ML IJ SOLN: 5.0000 mg | INTRAMUSCULAR | Status: DC | PRN

## 2019-02-15 MED ORDER — SOD CITRATE-CITRIC ACID 500-334 MG/5ML PO SOLN
30.0000 mL | Freq: Once | ORAL | Status: AC
  Administered 2019-02-15: 10:00:00 30 mL via ORAL
  Filled 2019-02-15: qty 30

## 2019-02-15 MED ORDER — ONDANSETRON HCL 4 MG/2ML IJ SOLN
INTRAMUSCULAR | Status: DC | PRN
  Administered 2019-02-15: 4 mg via INTRAVENOUS

## 2019-02-15 MED ORDER — NALOXONE HCL 4 MG/10ML IJ SOLN
1.0000 ug/kg/h | INTRAVENOUS | Status: DC | PRN
  Filled 2019-02-15: qty 5

## 2019-02-15 MED ORDER — CEFAZOLIN SODIUM-DEXTROSE 2-4 GM/100ML-% IV SOLN
2.0000 g | INTRAVENOUS | Status: AC
  Administered 2019-02-15: 11:00:00 2 g via INTRAVENOUS
  Filled 2019-02-15: qty 100

## 2019-02-15 MED ORDER — PHENYLEPHRINE HCL-NACL 20-0.9 MG/250ML-% IV SOLN
INTRAVENOUS | Status: DC | PRN
  Administered 2019-02-15: 60 ug/min via INTRAVENOUS

## 2019-02-15 MED ORDER — OXYTOCIN 40 UNITS IN NORMAL SALINE INFUSION - SIMPLE MED: 2.5000 [IU]/h | INTRAVENOUS | Status: AC

## 2019-02-15 MED ORDER — SODIUM CHLORIDE 0.9% FLUSH: 3.0000 mL | INTRAVENOUS | Status: DC | PRN

## 2019-02-15 MED ORDER — TETANUS-DIPHTH-ACELL PERTUSSIS 5-2.5-18.5 LF-MCG/0.5 IM SUSP: 0.5000 mL | Freq: Once | INTRAMUSCULAR | Status: DC

## 2019-02-15 MED ORDER — PHENYLEPHRINE HCL (PRESSORS) 10 MG/ML IV SOLN
INTRAVENOUS | Status: DC | PRN
  Administered 2019-02-15: 80 ug via INTRAVENOUS

## 2019-02-15 MED ORDER — ACETAMINOPHEN 325 MG PO TABS
650.0000 mg | ORAL_TABLET | ORAL | Status: DC | PRN
  Administered 2019-02-15 – 2019-02-16 (×3): 650 mg via ORAL
  Filled 2019-02-15 (×3): qty 2

## 2019-02-15 MED ORDER — SODIUM CHLORIDE 0.9 % IV SOLN
INTRAVENOUS | Status: DC | PRN
  Administered 2019-02-15: 11:00:00 via INTRAVENOUS

## 2019-02-15 MED ORDER — NALBUPHINE HCL 10 MG/ML IJ SOLN: 5.0000 mg | Freq: Once | INTRAMUSCULAR | Status: DC | PRN

## 2019-02-15 MED ORDER — PRENATAL MULTIVITAMIN CH
1.0000 | ORAL_TABLET | Freq: Every day | ORAL | Status: DC
  Administered 2019-02-16: 1 via ORAL
  Filled 2019-02-15: qty 1

## 2019-02-15 MED ORDER — ZOLPIDEM TARTRATE 5 MG PO TABS: 5.0000 mg | ORAL_TABLET | Freq: Every evening | ORAL | Status: DC | PRN

## 2019-02-15 MED ORDER — DEXAMETHASONE SODIUM PHOSPHATE 4 MG/ML IJ SOLN
INTRAMUSCULAR | Status: DC | PRN
  Administered 2019-02-15: 4 mg via INTRAVENOUS

## 2019-02-15 MED ORDER — MEPERIDINE HCL 25 MG/ML IJ SOLN: 6.2500 mg | INTRAMUSCULAR | Status: DC | PRN

## 2019-02-15 MED ORDER — MORPHINE SULFATE (PF) 0.5 MG/ML IJ SOLN
INTRAMUSCULAR | Status: DC | PRN
  Administered 2019-02-15: .15 mg via INTRATHECAL

## 2019-02-15 MED ORDER — KETOROLAC TROMETHAMINE 30 MG/ML IJ SOLN
30.0000 mg | Freq: Four times a day (QID) | INTRAMUSCULAR | Status: AC | PRN
  Administered 2019-02-15: 12:00:00 30 mg via INTRAMUSCULAR

## 2019-02-15 MED ORDER — STERILE WATER FOR IRRIGATION IR SOLN
Status: DC | PRN
  Administered 2019-02-15: 1

## 2019-02-15 MED ORDER — LACTATED RINGERS IV SOLN
INTRAVENOUS | Status: DC | PRN
  Administered 2019-02-15: 11:00:00 via INTRAVENOUS

## 2019-02-15 MED ORDER — SIMETHICONE 80 MG PO CHEW
80.0000 mg | CHEWABLE_TABLET | ORAL | Status: DC
  Administered 2019-02-15 – 2019-02-16 (×2): 80 mg via ORAL
  Filled 2019-02-15 (×2): qty 1

## 2019-02-15 MED ORDER — DEXAMETHASONE SODIUM PHOSPHATE 4 MG/ML IJ SOLN
INTRAMUSCULAR | Status: AC
  Filled 2019-02-15: qty 1

## 2019-02-15 MED ORDER — NALOXONE HCL 0.4 MG/ML IJ SOLN: 0.4000 mg | INTRAMUSCULAR | Status: DC | PRN

## 2019-02-15 MED ORDER — MORPHINE SULFATE (PF) 0.5 MG/ML IJ SOLN
INTRAMUSCULAR | Status: AC
  Filled 2019-02-15: qty 10

## 2019-02-15 MED ORDER — KETOROLAC TROMETHAMINE 30 MG/ML IJ SOLN
INTRAMUSCULAR | Status: AC
  Filled 2019-02-15: qty 1

## 2019-02-15 MED ORDER — SODIUM CHLORIDE 0.9 % IV SOLN
INTRAVENOUS | Status: DC | PRN
  Administered 2019-02-15: 40 [IU] via INTRAVENOUS

## 2019-02-15 MED ORDER — SODIUM CHLORIDE 0.9 % IR SOLN
Status: DC | PRN
  Administered 2019-02-15: 1

## 2019-02-15 MED ORDER — DIPHENHYDRAMINE HCL 50 MG/ML IJ SOLN: 12.5000 mg | INTRAMUSCULAR | Status: DC | PRN

## 2019-02-15 MED ORDER — ONDANSETRON HCL 4 MG/2ML IJ SOLN
INTRAMUSCULAR | Status: AC
  Filled 2019-02-15: qty 2

## 2019-02-15 MED ORDER — PHENYLEPHRINE HCL-NACL 20-0.9 MG/250ML-% IV SOLN
INTRAVENOUS | Status: AC
  Filled 2019-02-15: qty 250

## 2019-02-15 MED ORDER — COCONUT OIL OIL: 1.0000 "application " | TOPICAL_OIL | Status: DC | PRN

## 2019-02-15 SURGICAL SUPPLY — 30 items
BENZOIN TINCTURE PRP APPL 2/3 (GAUZE/BANDAGES/DRESSINGS) ×3 IMPLANT
CHLORAPREP W/TINT 26ML (MISCELLANEOUS) ×3 IMPLANT
CLAMP CORD UMBIL (MISCELLANEOUS) IMPLANT
CLOSURE STERI-STRIP 1/2X4 (GAUZE/BANDAGES/DRESSINGS) ×1
CLOTH BEACON ORANGE TIMEOUT ST (SAFETY) ×3 IMPLANT
CLSR STERI-STRIP ANTIMIC 1/2X4 (GAUZE/BANDAGES/DRESSINGS) ×2 IMPLANT
DRSG OPSITE POSTOP 4X10 (GAUZE/BANDAGES/DRESSINGS) ×3 IMPLANT
ELECT REM PT RETURN 9FT ADLT (ELECTROSURGICAL) ×3
ELECTRODE REM PT RTRN 9FT ADLT (ELECTROSURGICAL) ×1 IMPLANT
EXTRACTOR VACUUM M CUP 4 TUBE (SUCTIONS) IMPLANT
EXTRACTOR VACUUM M CUP 4' TUBE (SUCTIONS)
GLOVE BIOGEL PI IND STRL 7.0 (GLOVE) ×1 IMPLANT
GLOVE BIOGEL PI INDICATOR 7.0 (GLOVE) ×2
GLOVE ECLIPSE 7.0 STRL STRAW (GLOVE) ×6 IMPLANT
GOWN STRL REUS W/TWL LRG LVL3 (GOWN DISPOSABLE) ×6 IMPLANT
KIT ABG SYR 3ML LUER SLIP (SYRINGE) IMPLANT
NEEDLE HYPO 25X5/8 SAFETYGLIDE (NEEDLE) IMPLANT
NS IRRIG 1000ML POUR BTL (IV SOLUTION) ×3 IMPLANT
PACK C SECTION WH (CUSTOM PROCEDURE TRAY) ×3 IMPLANT
PAD OB MATERNITY 4.3X12.25 (PERSONAL CARE ITEMS) ×3 IMPLANT
RETRACTOR WND ALEXIS 25 LRG (MISCELLANEOUS) ×1 IMPLANT
RTRCTR WOUND ALEXIS 25CM LRG (MISCELLANEOUS) ×3
SUT MNCRL 0 VIOLET CTX 36 (SUTURE) ×3 IMPLANT
SUT MON AB 2-0 CT1 27 (SUTURE) ×6 IMPLANT
SUT MONOCRYL 0 CTX 36 (SUTURE) ×6
SUT PLAIN 0 NONE (SUTURE) IMPLANT
SUT PLAIN 2 0 XLH (SUTURE) ×3 IMPLANT
TOWEL OR 17X24 6PK STRL BLUE (TOWEL DISPOSABLE) ×3 IMPLANT
TRAY FOLEY W/BAG SLVR 14FR LF (SET/KITS/TRAYS/PACK) IMPLANT
WATER STERILE IRR 1000ML POUR (IV SOLUTION) ×3 IMPLANT

## 2019-02-15 NOTE — Transfer of Care (Signed)
Immediate Anesthesia Transfer of Care Note  Patient: Elizabeth Caldwell  Procedure(s) Performed: CESAREAN SECTION (N/A )  Patient Location: PACU  Anesthesia Type:Spinal  Level of Consciousness: awake, alert  and oriented  Airway & Oxygen Therapy: Patient Spontanous Breathing  Post-op Assessment: Report given to RN and Post -op Vital signs reviewed and stable  Post vital signs: Reviewed and stable Hr 83, RR 12, Bp 90/43, SaO2100%  Last Vitals:  Vitals Value Taken Time  BP 90/43 02/15/19 1135  Temp    Pulse 80 02/15/19 1141  Resp 19 02/15/19 1141  SpO2 99 % 02/15/19 1141  Vitals shown include unvalidated device data.  Last Pain: There were no vitals filed for this visit.       Complications: No apparent anesthesia complications

## 2019-02-15 NOTE — Anesthesia Postprocedure Evaluation (Signed)
Anesthesia Post Note  Patient: Elizabeth Caldwell  Procedure(s) Performed: CESAREAN SECTION (N/A )     Patient location during evaluation: PACU Anesthesia Type: Spinal Level of consciousness: oriented and awake and alert Pain management: pain level controlled Vital Signs Assessment: post-procedure vital signs reviewed and stable Respiratory status: spontaneous breathing, respiratory function stable and nonlabored ventilation Cardiovascular status: blood pressure returned to baseline and stable Postop Assessment: no headache, no backache, no apparent nausea or vomiting, spinal receding and patient able to bend at knees Anesthetic complications: no    Last Vitals:  Vitals:   02/15/19 1230 02/15/19 1245  BP: 107/61 (!) 110/58  Pulse: 68 72  Resp: 20 17  Temp:    SpO2: 98% 98%    Last Pain:  Vitals:   02/15/19 1136  TempSrc: Oral   Pain Goal:                Epidural/Spinal Function Cutaneous sensation: Tingles (02/15/19 1230), Patient able to flex knees: Yes (02/15/19 1230), Patient able to lift hips off bed: No (02/15/19 1230), Back pain beyond tenderness at insertion site: No (02/15/19 1230), Progressively worsening motor and/or sensory loss: No (02/15/19 1230), Bowel and/or bladder incontinence post epidural: No (02/15/19 1230)  Kurtis Anastasia A.

## 2019-02-15 NOTE — Op Note (Signed)
NAMESHERREL, PLOCH MEDICAL RECORD [id number] ACCOUNT [id number] DATE OF BIRTH:October 17, 1980 FACILITY: [facility] LOCATION: [facility] PHYSICIAN:MARK Kathline Magic, MD  OPERATIVE REPORT  DATE OF PROCEDURE:  02/15/2019  PREOPERATIVE DIAGNOSES:   1.  Intrauterine pregnancy at term. 2.  Spontaneous rupture of membranes. 3.  History of prior cesarean section. 4.  The patient declines attempt at vaginal delivery after cesarean section.  POSTOPERATIVE DIAGNOSES:   1.  Intrauterine pregnancy at term. 2.  Spontaneous rupture of membranes. 3.  History of prior cesarean section. 4.  The patient declines attempt at vaginal delivery after cesarean section.  PROCEDURE:  Repeat low transverse cesarean section.  SURGEON:  Freda Munro, MD  ANESTHESIA:  Spinal.  ANTIBIOTICS:  Ancef 2 grams.  DRAINS:  Foley, bedside drainage.  ESTIMATED BLOOD LOSS:  900 mL  SPECIMENS:  None.  FINDINGS:  The patient had normal fallopian tubes and ovaries bilaterally.  The uterine scar appeared to be normal.  She delivered one live viable white female infant, vertex presentation.  Placenta appeared to be normal.  DESCRIPTION OF PROCEDURE:  The patient was taken to the operating room where spinal anesthetic was administered without difficulty.  She was then prepped and draped in the usual fashion for this procedure.  A Pfannenstiel incision was made through the  previous scar on entering the abdominal cavity.  The bladder flap was taken down with sharp dissection.  A low transverse uterine incision was made in the midline with Metzenbaum scissors and extended laterally with blunt dissection.  Amniotic fluid was  noted to be clear.  The infant was then delivered in the vertex presentation.  There was a nuchal cord x1.  Remaining infant was then delivered.  The cord was clamped and cut and the infant handed to the awaiting NICU team.  Cord blood was obtained.  The  placenta was then manually removed.  Uterus was wiped  with a wet lap and examined.  The uterine incision was closed in a single layer of 0 Monocryl suture in a running locking fashion.  The bladder flap was closed using 0 Monocryl suture in a running  fashion.  The rectus muscles and parietal peritoneum were reapproximated in the midline using 2-0 Monocryl in a running fashion.  Fascia was closed using 0 Monocryl suture in a running fashion.  Subcuticular tissue was irrigated and made hemostatic with  the Bovie, then closed with 2-0 plain gut suture.  The skin incision was closed using a 3-0 Vicryl in a subcuticular fashion followed by placement of Steri-Strips.  The patient was taken to recovery room in stable condition.  Instrument and lap counts  correct x3.  TN/NUANCE  D:02/15/2019 T:02/15/2019 JOB:[id number]

## 2019-02-15 NOTE — Lactation Note (Signed)
This note was copied from a baby's chart. Lactation Consultation Note  Patient Name: Elizabeth Caldwell [id number] Date: 02/15/2019 Reason for consult: Initial assessment;Early term 72-38.6wks  P2 mother whose infant is now 4 hours old.  This is an ETI at 37+2 weeks.  Mother breast fed her first child (now 36 years old) for 3 months and then pumped and bottle fed until 10 months.  She plans to breast/bottle feed (with her EBM) with this child.  Baby was asleep in mother's arms when I arrived.  Mother had no questions/concerns related to breast feeding.  She is familiar with feeding cues and hand expression and did not wish to review these concepts.  Colostrum container provided and milk storage times reviewed.  Mother seems very confident in her breast feeding ability and stated that baby has already fed well.    Encouraged to feed 8-12 times/24 hours or sooner if baby shows cues.  She will call for lactation assistance if needed.  Father present.  Mom made aware of O/P services, breastfeeding support groups, community resources, and our phone # for post-discharge questions.    Maternal Data Formula Feeding for Exclusion: No Has patient been taught Hand Expression?: Yes Does the patient have breastfeeding experience prior to this delivery?: Yes  Feeding    LATCH Score                    Interventions    Lactation Tools Discussed/Used     Consult Status Consult Status: Follow-up Date: 02/16/19 Follow-up type: In-patient    Little Ishikawa 02/15/2019, 7:47 PM

## 2019-02-15 NOTE — H&P (Signed)
Elizabeth Caldwell is an 38 y.o. G3P1011 [redacted]w[redacted]d white female who presents to the ER with SROM. In the ER she had +pool/+fern. PNC was complicated by AMA. GBS-neg/Nl NIPT/Nl OGTT. She was counselled on a VBAC and declined.   Past Medical History:  Diagnosis Date  . Cluster headache   . Torn ACL    x3    Past Surgical History:  Procedure Laterality Date  . ANTERIOR CRUCIATE LIGAMENT REPAIR    . CESAREAN SECTION N/A 10/11/2016   Procedure: CESAREAN SECTION;  Surgeon: Olga Millers, MD;  Location: [place];  Service: Obstetrics;  Laterality: N/A;  . HERNIA REPAIR    . NASAL SEPTUM SURGERY      Family History  Problem Relation Age of Onset  . Hypertension Mother   . Hypertension Father    Social History:  reports that she has never smoked. She has never used smokeless tobacco. She reports current alcohol use of about 1.0 standard drinks of alcohol per week. She reports that she does not use drugs.  Allergies: No Known Allergies  Medications Prior to Admission  Medication Sig Dispense Refill  . Prenatal Vit-Fe Fumarate-FA (PRENATAL MULTIVITAMIN) TABS tablet Take 1 tablet by mouth daily at 12 noon.    Marland Kitchen acetaminophen (TYLENOL) 500 MG tablet Take 500 mg by mouth every 6 (six) hours as needed. For pain     . oxyCODONE-acetaminophen (PERCOCET/ROXICET) 5-325 MG tablet Take 2 tablets by mouth every 4 (four) hours as needed for severe pain (pain greater than 7). 30 tablet 0       Blood pressure 136/83, pulse (!) 106, temperature 98.3 F (36.8 C), resp. rate 16, height 5\' 5"  (1.651 m), weight 78 kg, last menstrual period 05/30/2018, SpO2 100 %, unknown if currently breastfeeding. General appearance: alert and cooperative Abdomen: gravid, non tender  +pool/+fern   Lab Results  Component Value Date   WBC 20.3 (H) 10/12/2016   HGB 9.2 (L) 10/12/2016   HCT 25.7 (L) 10/12/2016   MCV 90.8 10/12/2016   PLT 124 (L) 10/12/2016   No results found for: PREGTESTUR, PREGSERUM, HCG,  HCGQUANT    Patient Active Problem List   Diagnosis Date Noted  . Pregnancy 10/11/2016   IMP/ IUP at term with SROM         H.O. prior c/s Declines VBAC         AMA Plan/ To OR when staff is ready [place] E 02/15/2019, 8:30 AM

## 2019-02-15 NOTE — Anesthesia Procedure Notes (Signed)
Spinal  Patient location during procedure: OR Start time: 02/15/2019 10:38 AM End time: 02/15/2019 10:42 AM Staffing Anesthesiologist: Josephine Igo, MD Performed: anesthesiologist  Preanesthetic Checklist Completed: patient identified, site marked, surgical consent, pre-op evaluation, timeout performed, IV checked, risks and benefits discussed and monitors and equipment checked Spinal Block Patient position: sitting Prep: site prepped and draped and DuraPrep Patient monitoring: heart rate, cardiac monitor, continuous pulse ox and blood pressure Approach: midline Location: L3-4 Injection technique: single-shot Needle Needle type: Pencan  Needle gauge: 24 G Needle length: 9 cm Needle insertion depth: 5 cm Assessment Sensory level: T4 Additional Notes Patient tolerated procedure well. Adequate sensory level.

## 2019-02-15 NOTE — MAU Note (Signed)
Pt presents to MAU with complaints of LOF that started at 0615 this morning. Pt is scheduled for a repeat C/S on July the 15th. Denies any contractions

## 2019-02-15 NOTE — Anesthesia Preprocedure Evaluation (Addendum)
Anesthesia Evaluation  Patient identified by MRN, date of birth, ID band Patient awake    Reviewed: Allergy & Precautions, NPO status , Patient's Chart, lab work & pertinent test results  Airway Mallampati: I  TM Distance: >3 FB Neck ROM: Full    Dental no notable dental hx. (+) Teeth Intact   Pulmonary neg pulmonary ROS,    Pulmonary exam normal breath sounds clear to auscultation       Cardiovascular negative cardio ROS Normal cardiovascular exam Rhythm:Regular Rate:Normal     Neuro/Psych  Headaches, Hx/o cluster HA's  negative psych ROS   GI/Hepatic Neg liver ROS, GERD  ,  Endo/Other  negative endocrine ROS  Renal/GU negative Renal ROS     Musculoskeletal negative musculoskeletal ROS (+)   Abdominal   Peds  Hematology  (+) anemia , Thrombocytopenia - mild   Anesthesia Other Findings   Reproductive/Obstetrics (+) Pregnancy Previous C/section SROM 37 weeks                             Anesthesia Physical Anesthesia Plan  ASA: II and emergent  Anesthesia Plan: Spinal   Post-op Pain Management:    Induction:   PONV Risk Score and Plan: 4 or greater and Scopolamine patch - Pre-op, Ondansetron and Treatment may vary due to age or medical condition  Airway Management Planned: Natural Airway  Additional Equipment:   Intra-op Plan:   Post-operative Plan:   Informed Consent: I have reviewed the patients History and Physical, chart, labs and discussed the procedure including the risks, benefits and alternatives for the proposed anesthesia with the patient or authorized representative who has indicated his/her understanding and acceptance.     Dental advisory given  Plan Discussed with: CRNA and Surgeon  Anesthesia Plan Comments:         Anesthesia Quick Evaluation

## 2019-02-16 ENCOUNTER — Encounter (HOSPITAL_COMMUNITY): Payer: Self-pay | Admitting: Obstetrics and Gynecology

## 2019-02-16 LAB — CBC
HCT: 30.1 % — ABNORMAL LOW (ref 36.0–46.0)
Hemoglobin: 10 g/dL — ABNORMAL LOW (ref 12.0–15.0)
MCH: 31.4 pg (ref 26.0–34.0)
MCHC: 33.2 g/dL (ref 30.0–36.0)
MCV: 94.7 fL (ref 80.0–100.0)
Platelets: 140 10*3/uL — ABNORMAL LOW (ref 150–400)
RBC: 3.18 MIL/uL — ABNORMAL LOW (ref 3.87–5.11)
RDW: 13.2 % (ref 11.5–15.5)
WBC: 14 10*3/uL — ABNORMAL HIGH (ref 4.0–10.5)
nRBC: 0 % (ref 0.0–0.2)

## 2019-02-16 LAB — ABO/RH: ABO/RH(D): A POS

## 2019-02-16 MED ORDER — IBUPROFEN 600 MG PO TABS
600.0000 mg | ORAL_TABLET | Freq: Four times a day (QID) | ORAL | Status: DC
  Administered 2019-02-16 – 2019-02-17 (×4): 600 mg via ORAL
  Filled 2019-02-16 (×4): qty 1

## 2019-02-16 NOTE — Lactation Note (Signed)
This note was copied from a baby's chart. Lactation Consultation Note  Patient Name: [name] [id number] Date: 02/16/2019 Reason for consult: Follow-up assessment;Infant weight loss;Early term 37-38.6wks(5 % weight loss) As LC entered the room , dad holding baby and mom resting in bed.  Per mom the baby cluster fed majority of night and has had two attempts this afternoon.  Mom reports swallows and the feedings have been comfortable.  Presently asleep in dads arms.  LC encouraged mom to call with feeding cues and if by 4pm has not awaked for feed - check diaper, change  If needed and call for LC.  Per mom the baby is suppose to have a circ this afternoon sometime with Dr. Harrington Challenger.  Per mom will have a Spectra 1 at home.   Maternal Data Has patient been taught Hand Expression?: (per mom feels comfortable)  Feeding    LATCH Score                   Interventions Interventions: Breast feeding basics reviewed  Lactation Tools Discussed/Used WIC Program: No Pump Review: Milk Storage   Consult Status Consult Status: Follow-up Date: 02/16/19 Follow-up type: In-patient    Jerlyn Ly Ezme Duch 02/16/2019, 3:06 PM

## 2019-02-17 MED ORDER — IBUPROFEN 600 MG PO TABS: 600.0000 mg | ORAL_TABLET | Freq: Four times a day (QID) | ORAL | 0 refills | Status: AC | PRN

## 2019-02-17 NOTE — Progress Notes (Signed)
Patient is doing well.  She is tolerating PO, ambulating, voiding.  Pain is controlled.  Lochia is appropriate  Vitals:   02/16/19 0648 02/16/19 1624 02/16/19 2135 02/17/19 0534  BP: 107/60 131/78 116/70 107/75  Pulse: 64 78 77 75  Resp: _0 Temp: 98.8 F (37.1 C) 98.2 F (36.8 C) 97.8 F (36.6 C) 98.1 F (36.7 C)  TempSrc: Oral Oral Oral Oral  SpO2: 97% 97% 97% 97%  Weight:      Height:        NAD Abdomen:  soft, appropriate tenderness, incisions intact and without erythema or drainage ext:    Symmetric, no edema bilaterally  Lab Results  Component Value Date   WBC 14.0 (H) 02/16/2019   HGB 10.0 (L) 02/16/2019   HCT 30.1 (L) 02/16/2019   MCV 94.7 02/16/2019   PLT 140 (L) 02/16/2019    --/--/A POS (07/01 0827)/RNonImmune  A/P    38 y.o. [id number] POD 2 s/p RCS Routine post op and postpartum care.   Rubella non-immune--MMR prior to discharge

## 2019-02-17 NOTE — Discharge Summary (Signed)
Obstetric Discharge Summary Reason for Admission: rupture of membranes Prenatal Procedures: none Intrapartum Procedures: cesarean: low cervical, transverse Postpartum Procedures: none--MMR recommended prior to discharge Complications-Operative and Postpartum: none Hemoglobin  Date Value Ref Range Status  02/16/2019 10.0 (L) 12.0 - 15.0 g/dL Final   HCT  Date Value Ref Range Status  02/16/2019 30.1 (L) 36.0 - 46.0 % Final    Physical Exam:  General: alert, cooperative and appears stated age 38: appropriate Uterine Fundus: firm Incision: healing well, no significant drainage DVT Evaluation: No evidence of DVT seen on physical exam.  Discharge Diagnoses: Term Pregnancy-delivered  Discharge Information: Date: 02/17/2019 Activity: pelvic rest Diet: routine Medications: PNV and Ibuprofen Condition: stable Instructions: refer to practice specific booklet Discharge to: home Follow-up Information    Elizabeth Millers, MD Follow up in 4 week(s).   Specialty: Obstetrics and Gynecology Contact information: [place] [id number] [phone]           Newborn Data: Live born female  Birth Weight: 6 lb 7.7 oz (2940 g) APGAR: 60, 9  Newborn Delivery   Birth date/time: 02/15/2019 11:01:00 Delivery type: C-Section, Low Transverse Trial of labor: No C-section categorization: Repeat      Home with mother.  [place] 02/17/2019, 8:41 AM

## 2019-03-01 ENCOUNTER — Inpatient Hospital Stay
Admission: AD | Admit: 2019-03-01 | Payer: BC Managed Care – PPO | Source: Ambulatory Visit | Admitting: Obstetrics and Gynecology

## 2024-08-02 ENCOUNTER — Ambulatory Visit

## 2024-08-02 ENCOUNTER — Encounter: Payer: Self-pay | Admitting: Podiatry

## 2024-08-02 ENCOUNTER — Ambulatory Visit: Admitting: Podiatry

## 2024-08-02 DIAGNOSIS — S92811A Other fracture of right foot, initial encounter for closed fracture: Secondary | ICD-10-CM

## 2024-08-02 DIAGNOSIS — M7742 Metatarsalgia, left foot: Secondary | ICD-10-CM | POA: Diagnosis not present

## 2024-08-02 MED ORDER — MELOXICAM 15 MG PO TABS: 15.0000 mg | ORAL_TABLET | Freq: Every day | ORAL | 3 refills | Status: DC

## 2024-08-02 NOTE — Progress Notes (Signed)
°  Subjective:  Patient ID: Elizabeth Caldwell, female    DOB: [birth year],  MRN: [id number] HPI Chief Complaint  Patient presents with   Foot Pain    Np left foot pain     43 y.o. female presents with the above complaint.   ROS: Denies fever chills nausea mobic  muscle aches pains calf pain back pain chest pain shortness of breath.  Past Medical History:  Diagnosis Date   Cluster headache    Torn ACL    x3   Past Surgical History:  Procedure Laterality Date   ANTERIOR CRUCIATE LIGAMENT REPAIR     CESAREAN SECTION N/A 10/11/2016   Procedure: CESAREAN SECTION;  Surgeon: Oneil FORBES Piety, MD;  Location: [facility];  Service: Obstetrics;  Laterality: N/A;   CESAREAN SECTION N/A 02/15/2019   Procedure: CESAREAN SECTION;  Surgeon: Piety Oneil FORBES, MD;  Location: [facility] LD ORS;  Service: Obstetrics;  Laterality: N/A;   HERNIA REPAIR     NASAL SEPTUM SURGERY     Current Medications[1]  Allergies[2] Review of Systems Objective:  There were no vitals filed for this visit.  General: Well developed, nourished, in no acute distress, alert and oriented x3   Dermatological: Skin is warm, dry and supple bilateral. Nails x 10 are well maintained; remaining integument appears unremarkable at this time. There are no open sores, no preulcerative lesions, no rash or signs of infection present.  Vascular: Dorsalis Pedis artery and Posterior Tibial artery pedal pulses are 2/4 bilateral with immedate capillary fill time. Pedal hair growth present. No varicosities and no lower extremity edema present bilateral.   Neruologic: Grossly intact via light touch bilateral. Vibratory intact via tuning fork bilateral. Protective threshold with Semmes Wienstein monofilament intact to all pedal sites bilateral. Patellar and Achilles deep tendon reflexes 2+ bilateral. No Babinski or clonus noted bilateral.   Musculoskeletal: No gross boney pedal deformities bilateral. No pain, crepitus, or limitation noted  with foot and ankle range of motion bilateral. Muscular strength 5/5 in all groups tested bilateral.  Pain on palpation tibial sesamoid sharp dorsiflexion and plantarflexion is painful.  Gait: Unassisted, Nonantalgic.    Radiographs:  Radiographs taken today demonstrate osseously mature individual with a fracture of the tibial sesamoid.  No acute findings noted.  Assessment & Plan:   Assessment: Fracture tibial sesamoid left foot  Plan: Discussed etiology pathology conservative surgical therapies at this point I recommended that she cut back on her exercise activities utilizing a cam boot I will follow-up with her in 6 weeks.  We did discuss the possible need for surgical intervention.     Zade Falkner T. Edrick Whitehorn, DPM    [1]  Current Outpatient Medications:    meloxicam  (MOBIC ) 15 MG tablet, Take 1 tablet (15 mg total) by mouth daily., Disp: 30 tablet, Rfl: 3   acetaminophen  (TYLENOL ) 500 MG tablet, Take 500 mg by mouth every 6 (six) hours as needed. For pain , Disp: , Rfl:    ibuprofen  (ADVIL ) 600 MG tablet, Take 1 tablet (600 mg total) by mouth every 6 (six) hours as needed for moderate pain or cramping., Disp: 60 tablet, Rfl: 0   Prenatal Vit-Fe Fumarate-FA (PRENATAL MULTIVITAMIN) TABS tablet, Take 1 tablet by mouth daily at 12 noon., Disp: , Rfl:  [2] No Known Allergies

## 2024-08-14 ENCOUNTER — Ambulatory Visit: Admitting: Orthopedic Surgery

## 2024-08-31 ENCOUNTER — Telehealth: Payer: Self-pay | Admitting: Lab

## 2024-08-31 ENCOUNTER — Other Ambulatory Visit: Payer: Self-pay | Admitting: Lab

## 2024-08-31 ENCOUNTER — Telehealth: Payer: Self-pay | Admitting: Podiatry

## 2024-08-31 MED ORDER — MELOXICAM 15 MG PO TABS: 15.0000 mg | ORAL_TABLET | Freq: Every day | ORAL | 3 refills | Status: AC

## 2024-08-31 NOTE — Telephone Encounter (Signed)
 Patient states she was suppose to have a RX sent over. Looks like it was sent to [facility] patient would like it sent to [facility]

## 2024-08-31 NOTE — Telephone Encounter (Signed)
 Patient calling states she was suppose to have a prescription called in and when she arrived at pharmacy their was nothing for her please advise.

## 2024-09-05 NOTE — Telephone Encounter (Signed)
 Patient states picked up prescription thanks.

## 2024-09-27 ENCOUNTER — Ambulatory Visit: Admitting: Podiatry
